# Patient Record
Sex: Female | Born: 1990 | Race: Black or African American | Hispanic: No | Marital: Single | Smoking: Current some day smoker
Health system: Southern US, Community
[De-identification: ages and names within clinical notes are randomized; demographics above are authoritative.]

## PROBLEM LIST (undated history)

## (undated) ENCOUNTER — Inpatient Hospital Stay: Payer: Self-pay

## (undated) DIAGNOSIS — I10 Essential (primary) hypertension: Secondary | ICD-10-CM

## (undated) DIAGNOSIS — D649 Anemia, unspecified: Secondary | ICD-10-CM

## (undated) HISTORY — PX: COLON SURGERY: SHX602

## (undated) HISTORY — PX: APPENDECTOMY: SHX54

## (undated) HISTORY — PX: SALPINGOSTOMY: SHX2372

---
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
## 2009-01-12 ENCOUNTER — Inpatient Hospital Stay: Payer: Self-pay | Admitting: Surgery

## 2009-01-21 ENCOUNTER — Emergency Department: Payer: Self-pay | Admitting: Emergency Medicine

## 2009-01-22 ENCOUNTER — Emergency Department: Payer: Self-pay | Admitting: Emergency Medicine

## 2009-02-18 ENCOUNTER — Ambulatory Visit: Payer: Self-pay | Admitting: Surgery

## 2009-10-21 ENCOUNTER — Ambulatory Visit: Payer: Self-pay | Admitting: Family Medicine

## 2009-10-27 ENCOUNTER — Observation Stay: Payer: Self-pay | Admitting: Obstetrics and Gynecology

## 2010-02-07 ENCOUNTER — Observation Stay: Payer: Self-pay | Admitting: Obstetrics and Gynecology

## 2010-03-17 ENCOUNTER — Inpatient Hospital Stay: Payer: Self-pay | Admitting: Obstetrics and Gynecology

## 2010-10-28 ENCOUNTER — Emergency Department: Payer: Self-pay | Admitting: Internal Medicine

## 2011-02-09 ENCOUNTER — Emergency Department: Payer: Self-pay | Admitting: Internal Medicine

## 2011-04-22 ENCOUNTER — Emergency Department: Payer: Self-pay | Admitting: Emergency Medicine

## 2011-08-22 ENCOUNTER — Emergency Department: Payer: Self-pay | Admitting: *Deleted

## 2011-08-22 ENCOUNTER — Emergency Department: Payer: Self-pay | Admitting: Emergency Medicine

## 2011-08-24 ENCOUNTER — Emergency Department: Payer: Self-pay | Admitting: *Deleted

## 2012-01-30 ENCOUNTER — Emergency Department: Payer: Self-pay | Admitting: Emergency Medicine

## 2012-04-15 ENCOUNTER — Emergency Department: Payer: Self-pay | Admitting: Emergency Medicine

## 2012-04-15 LAB — URINALYSIS, COMPLETE
Bilirubin,UR: NEGATIVE
Glucose,UR: NEGATIVE mg/dL (ref 0–75)
Ketone: NEGATIVE
Nitrite: NEGATIVE
Ph: 6 (ref 4.5–8.0)
Protein: NEGATIVE
RBC,UR: 7 /HPF (ref 0–5)
Squamous Epithelial: 4

## 2012-05-27 ENCOUNTER — Emergency Department: Payer: Self-pay | Admitting: *Deleted

## 2013-02-12 ENCOUNTER — Emergency Department: Payer: Self-pay | Admitting: Emergency Medicine

## 2013-04-04 ENCOUNTER — Emergency Department: Payer: Self-pay | Admitting: Emergency Medicine

## 2013-04-05 LAB — URINALYSIS, COMPLETE
Bacteria: NONE SEEN
Bilirubin,UR: NEGATIVE
Blood: NEGATIVE
Glucose,UR: NEGATIVE mg/dL (ref 0–75)
Nitrite: NEGATIVE
Protein: NEGATIVE
RBC,UR: 1 /HPF (ref 0–5)
Specific Gravity: 1.024 (ref 1.003–1.030)
Squamous Epithelial: 1

## 2013-04-05 LAB — WET PREP, GENITAL

## 2013-09-20 ENCOUNTER — Emergency Department: Payer: Self-pay | Admitting: Emergency Medicine

## 2014-04-09 ENCOUNTER — Emergency Department: Payer: Self-pay | Admitting: Emergency Medicine

## 2014-11-23 NOTE — L&D Delivery Note (Addendum)
Delivery Note At 10:39 AM a viable and healthy female was delivered via Vaginal, Spontaneous Delivery (Presentation: Left Occiput Anterior).  APGAR: 9, 9; weight 8 lb 15.9 oz (4080 g).   Placenta status: Intact, Spontaneous.  Cord: 3 vessels with the following complications: None.  Anesthesia: Epidural  Episiotomy: None Lacerations: 1st degree Suture Repair: 3.0 vicryl Est. Blood Loss (mL): 1200 - postpartum hemorrhage  Mom to postpartum.  Baby to Couplet care / Skin to Skin.  Pt admitted from the health dept for IOL for late term- pitocin started overnight. AROM for clear fluid in the morning. Pt entered the second stage and began to push will over an intact perineum. FOB at bedside. Baby delivered followed immediately by the anterior shoulder. Short cord clamped at the perineum. Vigorous infant placed on maternal abdomen. Brisk vaginal bleeding for total of 1200 ml. Postpartum pitocin bolused prior to completion of 3rd stage. Placenta delivered and was intact. Manual extraction of large clots x3 from lower uterine segment. cytotec placed in rectum to assist with atony. Small 1st degree perineal tear that extended to right labia repair with 3-0 vicryl. To vaginal or cervical lacerations. Firm bimanual massage to assist in atony. At conclusion, firm fundus with minimal bleeding.   Christeen Douglas 06/20/2015, 11:01 AM

## 2014-11-29 ENCOUNTER — Ambulatory Visit: Payer: Self-pay | Admitting: Physician Assistant

## 2015-01-30 ENCOUNTER — Ambulatory Visit: Payer: Self-pay | Admitting: Physician Assistant

## 2015-04-26 ENCOUNTER — Observation Stay
Admission: EM | Admit: 2015-04-26 | Discharge: 2015-04-26 | Disposition: A | Discharge status: Home / Self Care | Payer: Medicaid Other | Attending: Obstetrics and Gynecology | Admitting: Obstetrics and Gynecology

## 2015-04-26 ENCOUNTER — Encounter: Payer: Self-pay | Admitting: *Deleted

## 2015-04-26 DIAGNOSIS — Z3A33 33 weeks gestation of pregnancy: Secondary | ICD-10-CM | POA: Diagnosis not present

## 2015-04-26 DIAGNOSIS — O4703 False labor before 37 completed weeks of gestation, third trimester: Secondary | ICD-10-CM | POA: Diagnosis not present

## 2015-04-26 HISTORY — DX: Anemia, unspecified: D64.9

## 2015-04-26 LAB — URINALYSIS COMPLETE WITH MICROSCOPIC (ARMC ONLY)
Bacteria, UA: NONE SEEN
Bilirubin Urine: NEGATIVE
Glucose, UA: NEGATIVE mg/dL
Hgb urine dipstick: NEGATIVE
LEUKOCYTES UA: NEGATIVE
Nitrite: NEGATIVE
PROTEIN: 30 mg/dL — AB
SPECIFIC GRAVITY, URINE: 1.026 (ref 1.005–1.030)
pH: 6 (ref 5.0–8.0)

## 2015-04-26 LAB — CBC
HCT: 33.9 % — ABNORMAL LOW (ref 35.0–47.0)
Hemoglobin: 10.3 g/dL — ABNORMAL LOW (ref 12.0–16.0)
MCH: 21.5 pg — ABNORMAL LOW (ref 26.0–34.0)
MCHC: 30.3 g/dL — ABNORMAL LOW (ref 32.0–36.0)
MCV: 70.9 fL — ABNORMAL LOW (ref 80.0–100.0)
Platelets: 229 10*3/uL (ref 150–440)
RBC: 4.78 MIL/uL (ref 3.80–5.20)
RDW: 15.5 % — ABNORMAL HIGH (ref 11.5–14.5)
WBC: 12.7 10*3/uL — ABNORMAL HIGH (ref 3.6–11.0)

## 2015-04-26 LAB — CHLAMYDIA/NGC RT PCR (ARMC ONLY)
CHLAMYDIA TR: NOT DETECTED
N GONORRHOEAE: NOT DETECTED

## 2015-04-26 NOTE — OB Triage Provider Note (Addendum)
S: Resting comfortably. Reports CTX Q15-30 min starting this AM. She had sex ~ 24 hours ago which may be linked. Also limits ability to check FFN. No LOF or VB. Active fetal movement. She has been in the hospital for 2 hours and feels better compared to when she arrived.   O: BP 132/81 mmHg  Pulse 95  Temp(Src) 99.1 F (37.3 C) (Oral)  Resp 16  Ht 5\' 3"  (1.6 m)  Wt 107.956 kg (238 lb)  BMI 42.17 kg/m2 Gen: NAD, AAOx3      Abd: FNTTP      Ext: Non-tender, Nonedmeatous    FHT: 130s mod var + accelerations no decelerations TOCO: quiet SVE: FT/50/-3 stable x 2 exams two hours apart   A/P: 24 y.o. [id number] at 6275w1d   Labor: Not laboring CTX Q15--20 minutes, subtle on the toco. Strict precautions given. TVUS not available to check cervical length, FFN not possible due to recent intercourse.  No evidence of UTI. GBS collected in case she labors. No history of PTL.   FWB: Reassuring Cat 1 tracing.  D/c home stable, precautions reviewed, follow-up as scheduled.    Teena Iraniavid M. Derrill KayGoodman MD, MPH [facility] [phone]

## 2015-04-26 NOTE — OB Triage Note (Signed)
Recvd to OBS 2 complaining of contractions and intermittent pain in her thighs.  Changed to gown and to bed.  EFM applied.

## 2015-04-30 ENCOUNTER — Observation Stay
Admission: EM | Admit: 2015-04-30 | Discharge: 2015-04-30 | Disposition: A | Discharge status: Home / Self Care | Payer: Medicaid Other | Attending: Obstetrics and Gynecology | Admitting: Obstetrics and Gynecology

## 2015-04-30 ENCOUNTER — Encounter: Payer: Self-pay | Admitting: *Deleted

## 2015-04-30 DIAGNOSIS — O26893 Other specified pregnancy related conditions, third trimester: Secondary | ICD-10-CM | POA: Diagnosis present

## 2015-04-30 DIAGNOSIS — Z3A34 34 weeks gestation of pregnancy: Secondary | ICD-10-CM | POA: Diagnosis not present

## 2015-04-30 DIAGNOSIS — N898 Other specified noninflammatory disorders of vagina: Secondary | ICD-10-CM | POA: Diagnosis present

## 2015-04-30 DIAGNOSIS — Z3490 Encounter for supervision of normal pregnancy, unspecified, unspecified trimester: Secondary | ICD-10-CM

## 2015-04-30 LAB — CULTURE, BETA STREP (GROUP B ONLY)

## 2015-06-19 ENCOUNTER — Encounter: Payer: Self-pay | Admitting: *Deleted

## 2015-06-19 ENCOUNTER — Inpatient Hospital Stay
Admission: EM | Admit: 2015-06-19 | Discharge: 2015-06-21 | DRG: 774 | Disposition: A | Discharge status: Home / Self Care | Payer: Medicaid Other | Attending: Obstetrics and Gynecology | Admitting: Obstetrics and Gynecology

## 2015-06-19 DIAGNOSIS — Z87891 Personal history of nicotine dependence: Secondary | ICD-10-CM

## 2015-06-19 DIAGNOSIS — O9902 Anemia complicating childbirth: Secondary | ICD-10-CM | POA: Diagnosis present

## 2015-06-19 DIAGNOSIS — O480 Post-term pregnancy: Secondary | ICD-10-CM | POA: Diagnosis present

## 2015-06-19 DIAGNOSIS — Z3A41 41 weeks gestation of pregnancy: Secondary | ICD-10-CM | POA: Diagnosis present

## 2015-06-19 LAB — CBC
HCT: 33.3 % — ABNORMAL LOW (ref 35.0–47.0)
Hemoglobin: 10.3 g/dL — ABNORMAL LOW (ref 12.0–16.0)
MCH: 21.3 pg — ABNORMAL LOW (ref 26.0–34.0)
MCHC: 30.9 g/dL — AB (ref 32.0–36.0)
MCV: 69 fL — ABNORMAL LOW (ref 80.0–100.0)
Platelets: 205 10*3/uL (ref 150–440)
RBC: 4.83 MIL/uL (ref 3.80–5.20)
RDW: 17.6 % — AB (ref 11.5–14.5)
WBC: 10 10*3/uL (ref 3.6–11.0)

## 2015-06-19 LAB — TYPE AND SCREEN
ABO/RH(D): O POS
Antibody Screen: NEGATIVE

## 2015-06-19 MED ORDER — CITRIC ACID-SODIUM CITRATE 334-500 MG/5ML PO SOLN: 30.0000 mL | ORAL | Status: DC | PRN

## 2015-06-19 MED ORDER — LIDOCAINE HCL (PF) 1 % IJ SOLN
30.0000 mL | INTRAMUSCULAR | Status: DC | PRN
  Filled 2015-06-19: qty 30

## 2015-06-19 MED ORDER — BUTORPHANOL TARTRATE 1 MG/ML IJ SOLN
2.0000 mg | INTRAMUSCULAR | Status: DC | PRN
  Administered 2015-06-20: 2 mg via INTRAVENOUS
  Filled 2015-06-19: qty 2

## 2015-06-19 MED ORDER — TERBUTALINE SULFATE 1 MG/ML IJ SOLN
0.2500 mg | Freq: Once | INTRAMUSCULAR | Status: AC | PRN
Start: 2015-06-19 — End: 2015-06-19

## 2015-06-19 MED ORDER — LACTATED RINGERS IV SOLN
INTRAVENOUS | Status: DC
  Administered 2015-06-19 – 2015-06-20 (×2): via INTRAVENOUS

## 2015-06-19 MED ORDER — OXYTOCIN BOLUS FROM INFUSION
500.0000 mL | INTRAVENOUS | Status: DC
  Administered 2015-06-20: 500 mL via INTRAVENOUS

## 2015-06-19 MED ORDER — ACETAMINOPHEN 325 MG PO TABS: 650.0000 mg | ORAL_TABLET | ORAL | Status: DC | PRN

## 2015-06-19 MED ORDER — LACTATED RINGERS IV SOLN: 500.0000 mL | INTRAVENOUS | Status: DC | PRN

## 2015-06-19 MED ORDER — OXYTOCIN 40 UNITS IN LACTATED RINGERS INFUSION - SIMPLE MED: 62.5000 mL/h | INTRAVENOUS | Status: DC

## 2015-06-19 MED ORDER — ONDANSETRON HCL 4 MG/2ML IJ SOLN
4.0000 mg | Freq: Four times a day (QID) | INTRAMUSCULAR | Status: DC | PRN
  Administered 2015-06-20 (×2): 4 mg via INTRAVENOUS
  Filled 2015-06-19 (×2): qty 2

## 2015-06-19 MED ORDER — OXYTOCIN 40 UNITS IN LACTATED RINGERS INFUSION - SIMPLE MED
1.0000 m[IU]/min | INTRAVENOUS | Status: DC
  Administered 2015-06-19: 1 m[IU]/min via INTRAVENOUS
  Filled 2015-06-19: qty 1000

## 2015-06-19 NOTE — H&P (Signed)
CC: IOL for PD  EDD: 06/13/15 by 12 wk [place]  24yo [id number] @ 40+6wks here for PD IOL. Doing well. No complaints.   APC: Phineas Real  1. uncomplicated  OBHX: NSVD 2004 7#15, NSVD 2011 6#11 GYN: h/o L tubal cyst, s/p lapx excision;  PMH: none Psurghx: s/p Lapx appendectomy 2010, Lapx tubal cyst excision 2009 SH: former smoker Meds: iron, PNV ALL: NKDA  Labs: 12/19/14 - O+, ab neg, hct 34.5, pap NILM, RI, VZV I, RPR NR, GC/CT neg, hbsag neg, hiv neg glucola 103,  05/22/15 - GBS neg  Sono:  01/30/15 - normal anatomy sono (reviewed), ant placenta  O: BP 132/87 mmHg  Pulse 94  Temp(Src) 98.4 F (36.9 C) (Oral)  Resp 18  Ht  (1.6 m)  Wt 111.585 kg (246 lb)  BMI 43.59 kg/m2  NAD RRR CTAB Soft, NT wwp  Leopolds: 3600g, vtx SVE: by RN 4/50/-3  EFM: 140 mod var, +accels, no decels Toco: irritable  A/P: 24yo [id number] @ 40+6wks here for PD IOL.  1. Pitocin  2. Epidural when uncomfortable 3. AROM after epidural 4. Precautions reviewed with patient 5. Depo PP 6. Breast feeding 7. MC wants circ, but not now  Ala Dach, MD

## 2015-06-20 ENCOUNTER — Inpatient Hospital Stay: Payer: Medicaid Other | Admitting: Anesthesiology

## 2015-06-20 ENCOUNTER — Encounter: Payer: Self-pay | Admitting: Anesthesiology

## 2015-06-20 LAB — ABO/RH: ABO/RH(D): O POS

## 2015-06-20 MED ORDER — EPHEDRINE 5 MG/ML INJ
10.0000 mg | INTRAVENOUS | Status: DC | PRN
  Filled 2015-06-20: qty 2

## 2015-06-20 MED ORDER — LANOLIN HYDROUS EX OINT: TOPICAL_OINTMENT | CUTANEOUS | Status: DC | PRN

## 2015-06-20 MED ORDER — PRENATAL MULTIVITAMIN CH
1.0000 | ORAL_TABLET | Freq: Every day | ORAL | Status: DC
  Filled 2015-06-20: qty 1

## 2015-06-20 MED ORDER — IBUPROFEN 600 MG PO TABS
600.0000 mg | ORAL_TABLET | Freq: Four times a day (QID) | ORAL | Status: DC
  Administered 2015-06-20 – 2015-06-21 (×3): 600 mg via ORAL
  Filled 2015-06-20 (×3): qty 1

## 2015-06-20 MED ORDER — WITCH HAZEL-GLYCERIN EX PADS: 1.0000 "application " | MEDICATED_PAD | CUTANEOUS | Status: DC | PRN

## 2015-06-20 MED ORDER — FERROUS FUMARATE 325 (106 FE) MG PO TABS
1.0000 | ORAL_TABLET | Freq: Two times a day (BID) | ORAL | Status: DC
  Administered 2015-06-20 (×2): 106 mg via ORAL
  Filled 2015-06-20 (×5): qty 1

## 2015-06-20 MED ORDER — SENNOSIDES-DOCUSATE SODIUM 8.6-50 MG PO TABS
2.0000 | ORAL_TABLET | ORAL | Status: DC
  Administered 2015-06-21: 2 via ORAL
  Filled 2015-06-20: qty 2

## 2015-06-20 MED ORDER — FENTANYL 2.5 MCG/ML W/ROPIVACAINE 0.2% IN NS 100 ML EPIDURAL INFUSION (ARMC-ANES)
EPIDURAL | Status: AC
Start: 2015-06-20 — End: 2015-06-20
  Filled 2015-06-20: qty 100

## 2015-06-20 MED ORDER — SODIUM CHLORIDE 0.9 % IV SOLN: 250.0000 mL | INTRAVENOUS | Status: DC | PRN

## 2015-06-20 MED ORDER — OXYTOCIN 40 UNITS IN LACTATED RINGERS INFUSION - SIMPLE MED
62.5000 mL/h | INTRAVENOUS | Status: DC | PRN
  Administered 2015-06-20: 62.5 mL/h via INTRAVENOUS
  Filled 2015-06-20: qty 1000

## 2015-06-20 MED ORDER — ZOLPIDEM TARTRATE 5 MG PO TABS: 5.0000 mg | ORAL_TABLET | Freq: Every evening | ORAL | Status: DC | PRN

## 2015-06-20 MED ORDER — FLEET ENEMA 7-19 GM/118ML RE ENEM: 1.0000 | ENEMA | Freq: Every day | RECTAL | Status: DC | PRN

## 2015-06-20 MED ORDER — DIPHENHYDRAMINE HCL 25 MG PO CAPS: 25.0000 mg | ORAL_CAPSULE | Freq: Four times a day (QID) | ORAL | Status: DC | PRN

## 2015-06-20 MED ORDER — SODIUM CHLORIDE 0.9 % IJ SOLN: 3.0000 mL | Freq: Two times a day (BID) | INTRAMUSCULAR | Status: DC

## 2015-06-20 MED ORDER — IBUPROFEN 600 MG PO TABS
600.0000 mg | ORAL_TABLET | Freq: Four times a day (QID) | ORAL | Status: DC
  Administered 2015-06-20: 600 mg via ORAL
  Filled 2015-06-20: qty 1

## 2015-06-20 MED ORDER — LIDOCAINE-EPINEPHRINE (PF) 1.5 %-1:200000 IJ SOLN
INTRAMUSCULAR | Status: DC | PRN
  Administered 2015-06-20: 3 mL via PERINEURAL

## 2015-06-20 MED ORDER — DIBUCAINE 1 % RE OINT: 1.0000 "application " | TOPICAL_OINTMENT | RECTAL | Status: DC | PRN

## 2015-06-20 MED ORDER — PHENYLEPHRINE 40 MCG/ML (10ML) SYRINGE FOR IV PUSH (FOR BLOOD PRESSURE SUPPORT)
80.0000 ug | PREFILLED_SYRINGE | INTRAVENOUS | Status: DC | PRN
  Filled 2015-06-20: qty 2

## 2015-06-20 MED ORDER — OXYTOCIN 10 UNIT/ML IJ SOLN
INTRAMUSCULAR | Status: AC
  Filled 2015-06-20: qty 2

## 2015-06-20 MED ORDER — FENTANYL 2.5 MCG/ML W/ROPIVACAINE 0.2% IN NS 100 ML EPIDURAL INFUSION (ARMC-ANES)
10.0000 mL/h | EPIDURAL | Status: DC
  Administered 2015-06-20: 10 mL/h via EPIDURAL

## 2015-06-20 MED ORDER — BENZOCAINE-MENTHOL 20-0.5 % EX AERO
1.0000 "application " | INHALATION_SPRAY | CUTANEOUS | Status: DC | PRN
  Filled 2015-06-20: qty 56

## 2015-06-20 MED ORDER — ACETAMINOPHEN 325 MG PO TABS: 650.0000 mg | ORAL_TABLET | ORAL | Status: DC | PRN

## 2015-06-20 MED ORDER — DIPHENHYDRAMINE HCL 50 MG/ML IJ SOLN: 12.5000 mg | INTRAMUSCULAR | Status: DC | PRN

## 2015-06-20 MED ORDER — ONDANSETRON HCL 4 MG PO TABS: 4.0000 mg | ORAL_TABLET | ORAL | Status: DC | PRN

## 2015-06-20 MED ORDER — SIMETHICONE 80 MG PO CHEW: 80.0000 mg | CHEWABLE_TABLET | ORAL | Status: DC | PRN

## 2015-06-20 MED ORDER — OXYCODONE-ACETAMINOPHEN 5-325 MG PO TABS
1.0000 | ORAL_TABLET | ORAL | Status: DC | PRN
  Administered 2015-06-20: 1 via ORAL
  Filled 2015-06-20: qty 1

## 2015-06-20 MED ORDER — SODIUM CHLORIDE 0.9 % IJ SOLN: 3.0000 mL | INTRAMUSCULAR | Status: DC | PRN

## 2015-06-20 MED ORDER — TETANUS-DIPHTH-ACELL PERTUSSIS 5-2.5-18.5 LF-MCG/0.5 IM SUSP: 0.5000 mL | Freq: Once | INTRAMUSCULAR | Status: DC

## 2015-06-20 MED ORDER — MEDROXYPROGESTERONE ACETATE 150 MG/ML IM SUSP: 150.0000 mg | Freq: Once | INTRAMUSCULAR | Status: DC

## 2015-06-20 MED ORDER — LIDOCAINE-EPINEPHRINE (PF) 1.5 %-1:200000 IJ SOLN: INTRAMUSCULAR | Status: DC | PRN

## 2015-06-20 MED ORDER — PRENATAL MULTIVITAMIN CH
1.0000 | ORAL_TABLET | Freq: Every day | ORAL | Status: DC
  Administered 2015-06-20: 1 via ORAL

## 2015-06-20 MED ORDER — BISACODYL 10 MG RE SUPP
10.0000 mg | Freq: Every day | RECTAL | Status: DC | PRN
Start: 2015-06-20 — End: 2015-06-21

## 2015-06-20 MED ORDER — OXYCODONE-ACETAMINOPHEN 5-325 MG PO TABS
2.0000 | ORAL_TABLET | ORAL | Status: DC | PRN
Start: 2015-06-20 — End: 2015-06-21
  Administered 2015-06-21: 2 via ORAL
  Filled 2015-06-20: qty 2

## 2015-06-20 MED ORDER — AMMONIA AROMATIC IN INHA
RESPIRATORY_TRACT | Status: AC
  Filled 2015-06-20: qty 10

## 2015-06-20 MED ORDER — ONDANSETRON HCL 4 MG/2ML IJ SOLN: 4.0000 mg | INTRAMUSCULAR | Status: DC | PRN

## 2015-06-20 MED ORDER — FENTANYL 2.5 MCG/ML W/ROPIVACAINE 0.2% IN NS 100 ML EPIDURAL INFUSION (ARMC-ANES)
EPIDURAL | Status: AC
  Administered 2015-06-20: 10 mL/h via EPIDURAL
  Filled 2015-06-20: qty 100

## 2015-06-20 MED ORDER — BUPIVACAINE HCL (PF) 0.25 % IJ SOLN
INTRAMUSCULAR | Status: DC | PRN
  Administered 2015-06-20: 10 mL

## 2015-06-20 MED ORDER — MISOPROSTOL 200 MCG PO TABS
ORAL_TABLET | ORAL | Status: AC
  Administered 2015-06-20: 200 ug via RECTAL
  Filled 2015-06-20: qty 4

## 2015-06-20 MED ORDER — LIDOCAINE HCL (PF) 1 % IJ SOLN
INTRAMUSCULAR | Status: AC
  Filled 2015-06-20: qty 30

## 2015-06-20 MED ORDER — MEASLES, MUMPS & RUBELLA VAC ~~LOC~~ INJ: 0.5000 mL | INJECTION | Freq: Once | SUBCUTANEOUS | Status: DC

## 2015-06-20 NOTE — Progress Notes (Signed)
Patient ID: Laura Watkins, female   DOB: January 06, 1991, 24 y.o.   MRN: [id number]  24yo G3p2 @ 41wks here for PD IOL. Doing well. S/p epidural, on pitocin.   O: BP 111/62 mmHg  Pulse 85  Temp(Src) 98.1 F (36.7 C) (Oral)  Resp 18  Ht  (1.6 m)  Wt 111.585 kg (246 lb)  BMI 43.59 kg/m2  SpO2 100%  SVE: 5/70/-2, AROM'd clear fluid EFM: 120, mod var, +accels, no decels Toco: Q20min  A/P:  - continue IOL, transitioning into active labor - check in 2 hrs or pressure - cat 1 tracing  Ala Dach, MD

## 2015-06-20 NOTE — Progress Notes (Signed)
Laura Watkins is a 24 y.o. G3P2002 at [redacted]w[redacted]d admitted for induction for postdates  Subjective: Feeling strong urge to push; epidural working well  Objective: BP 108/52 mmHg  Pulse 109  Temp(Src) 98.7 F (37.1 C) (Oral)  Resp 20  Ht  (1.6 m)  Wt 111.585 kg (246 lb)  BMI 43.59 kg/m2  SpO2 100% I/O last 3 completed shifts: In: 1204.3 [I.V.:1204.3] Out: -  Total I/O In: 150.5 [I.V.:150.5] Out: 150 [Emesis/NG output:150]  FHT:  FHR: 135 bpm, variability: moderate,  accelerations:  Present,  decelerations:  Present variables UC:   regular, every 2 minutes SVE:   Dilation: 10 Effacement (%): 100 Station: -1 Exam by:: TFH   Presentation: LOT/LOA  Labs: Lab Results  Component Value Date   WBC 10.0 06/19/2015   HGB 10.3* 06/19/2015   HCT 33.3* 06/19/2015   MCV 69.0* 06/19/2015   PLT 205 06/19/2015    Assessment / Plan: Progressing through second stage; pushing effectively. Anticipate vaginal delivery. Stool at bedside for suprapubic if needed.    Christeen Douglas 06/20/2015, 10:15 AM

## 2015-06-20 NOTE — Anesthesia Preprocedure Evaluation (Signed)
Anesthesia Evaluation  Patient identified by MRN, date of birth, ID band Patient awake    Reviewed: Allergy & Precautions, NPO status , Patient's Chart, lab work & pertinent test results  Airway Mallampati: II  TM Distance: >3 FB     Dental no notable dental hx.    Pulmonary former smoker,  breath sounds clear to auscultation  Pulmonary exam normal       Cardiovascular negative cardio ROS Normal cardiovascular exam    Neuro/Psych negative neurological ROS     GI/Hepatic negative GI ROS, Neg liver ROS,   Endo/Other  negative endocrine ROS  Renal/GU negative Renal ROS  negative genitourinary   Musculoskeletal negative musculoskeletal ROS (+)   Abdominal Normal abdominal exam  (+)   Peds negative pediatric ROS (+)  Hematology  (+) anemia ,   Anesthesia Other Findings   Reproductive/Obstetrics (+) Pregnancy                             Anesthesia Physical Anesthesia Plan  ASA: II  Anesthesia Plan: Epidural   Post-op Pain Management:    Induction: Intravenous  Airway Management Planned:   Additional Equipment:   Intra-op Plan:   Post-operative Plan:   Informed Consent: I have reviewed the patients History and Physical, chart, labs and discussed the procedure including the risks, benefits and alternatives for the proposed anesthesia with the patient or authorized representative who has indicated his/her understanding and acceptance.   Dental advisory given  Plan Discussed with: CRNA and Surgeon  Anesthesia Plan Comments:         Anesthesia Quick Evaluation

## 2015-06-20 NOTE — Anesthesia Procedure Notes (Signed)
Epidural Patient location during procedure: OB Start time: 06/20/2015 2:26 AM End time: 06/20/2015 2:48 AM  Staffing Anesthesiologist: Yves Dill Performed by: anesthesiologist   Preanesthetic Checklist Completed: patient identified, site marked, surgical consent, pre-op evaluation, timeout performed, IV checked, risks and benefits discussed and monitors and equipment checked  Epidural Patient position: sitting Prep: Betadine and site prepped and draped Patient monitoring: heart rate, cardiac monitor, continuous pulse ox and blood pressure Approach: midline Location: L4-L5 Injection technique: LOR air  Needle:  Needle type: Tuohy  Needle gauge: 18 G Needle length: 9 cm Catheter type: closed end flexible Catheter size: 20 Guage Test dose: negative and 1.5% lidocaine with Epi 1:200 K  Assessment Sensory level: T8  Additional Notes Time out called.  Pt prepped and draped in sterile fashion.  A skin wheal was made at the L3-L4 interspace and epidurally space easily accessed.  Catheter threaded with return of blood.  Skin wheal made at L4-L5 interspace after appropriate local with 1% Lidocaine plain and catheter threaded easily approximately 3cm with no aspiration of blood and negative test dose and no paresthesias.  Patient was highly mobile at times and was cautioned.  Wide prep area covered down to sacral area. Reason for block:procedure for pain

## 2015-06-21 LAB — CBC
HEMATOCRIT: 29.9 % — AB (ref 35.0–47.0)
Hemoglobin: 9.3 g/dL — ABNORMAL LOW (ref 12.0–16.0)
MCH: 21.9 pg — ABNORMAL LOW (ref 26.0–34.0)
MCHC: 31.2 g/dL — AB (ref 32.0–36.0)
MCV: 70.2 fL — ABNORMAL LOW (ref 80.0–100.0)
PLATELETS: 170 10*3/uL (ref 150–440)
RBC: 4.25 MIL/uL (ref 3.80–5.20)
RDW: 18 % — ABNORMAL HIGH (ref 11.5–14.5)
WBC: 10.7 10*3/uL (ref 3.6–11.0)

## 2015-06-21 LAB — RPR: RPR Ser Ql: NONREACTIVE

## 2015-06-21 MED ORDER — IBUPROFEN 600 MG PO TABS: 600.0000 mg | ORAL_TABLET | Freq: Four times a day (QID) | ORAL | Status: DC

## 2015-06-21 MED ORDER — MEDROXYPROGESTERONE ACETATE 150 MG/ML IM SUSP
150.0000 mg | Freq: Once | INTRAMUSCULAR | Status: AC
  Administered 2015-06-21: 150 mg via INTRAMUSCULAR
  Filled 2015-06-21: qty 1

## 2015-06-21 MED ORDER — LANOLIN HYDROUS EX OINT: 1.0000 "application " | TOPICAL_OINTMENT | CUTANEOUS | Status: DC | PRN

## 2015-06-21 NOTE — Discharge Summary (Signed)
  Obstetric Discharge Summary   Patient ID: Laura Watkins MRN: [id number] DOB/AGE: 12-27-1990 24 y.o.   Date of Admission: 06/19/2015  Date of Discharge:   Admitting Diagnosis: Induction of labor at [redacted]w[redacted]d  Secondary Diagnosis: None  Mode of Delivery: normal spontaneous vaginal delivery     Discharge Diagnosis: Term vaginal delivery   Intrapartum Procedures: Atificial rupture of membranes, epidural and pitocin augmentation   Post partum procedures: Depo Provera  Complications: 1st degree perineal laceration   Brief Hospital Course  Laura Watkins is a [id number] who had a SVD on 06/20/15;  for further details of this delivery, please refer to the delivery note.  Patient had an uncomplicated postpartum course.  By time of discharge on PPD#1, her pain was controlled on oral pain medications; she had appropriate lochia and was ambulating, voiding without difficulty and tolerating regular diet.  She was deemed stable for discharge to home.  No further bleeding after delivery of placenta.     Labs: CBC Latest Ref Rng 06/21/2015 06/19/2015 04/26/2015  WBC 3.6 - 11.0 K/uL 10.7 10.0 12.7(H)  Hemoglobin 12.0 - 16.0 g/dL 1.4(N) 10.3(L) 10.3(L)  Hematocrit 35.0 - 47.0 % 29.9(L) 33.3(L) 33.9(L)  Platelets 150 - 440 K/uL 170 205 229   O POS  Physical exam:  Blood pressure 113/70, pulse 69, temperature 98.3 F (36.8 C), temperature source Oral, resp. rate 14, height  (1.6 m), weight 111.585 kg (246 lb), SpO2 100 %, unknown if currently breastfeeding. General: alert and no distress Lochia: appropriate Abdomen: soft, NT Uterine Fundus: firm Extremities: No evidence of DVT seen on physical exam. No lower extremity edema.  Discharge Instructions: Per After Visit Summary. Activity: Advance as tolerated. Pelvic rest for 6 weeks.  Also refer to After Visit Summary Diet: Regular Medications:   Medication List    TAKE these medications        ibuprofen 600 MG tablet  Commonly known  as:  ADVIL,MOTRIN  Take 1 tablet (600 mg total) by mouth every 6 (six) hours.     lanolin Oint  Apply 1 application topically as needed (for breast care).       Outpatient follow up:      Follow-up Information    Follow up with Christeen Douglas, MD In 6 weeks.   Specialty:  Obstetrics and Gynecology   Why:  For postpartum visit   Contact information:   [address] [place] [address] [phone]      Postpartum contraception: Depo-Provera  Discharged Condition: good  Discharged to: home   Newborn Data:  Baby boy  Disposition:home with mother  Apgars: APGAR (1 MIN): 9   APGAR (5 MINS): 9   APGAR (10 MINS):    Baby Feeding: Breast  Christeen Douglas, MD 06/21/2015

## 2015-06-21 NOTE — Discharge Instructions (Signed)
Care After Vaginal Delivery °Congratulations on your new baby!! ° °Refer to this sheet in the next few weeks. These discharge instructions provide you with information on caring for yourself after delivery. Your caregiver may also give you specific instructions. Your treatment has been planned according to the most current medical practices available, but problems sometimes occur. Call your caregiver if you have any problems or questions after you go home. ° °HOME CARE INSTRUCTIONS °· Take over-the-counter or prescription medicines only as directed by your caregiver or pharmacist. °· Do not drink alcohol, especially if you are breastfeeding or taking medicine to relieve pain. °· Do not chew or smoke tobacco. °· Do not use illegal drugs. °· Continue to use good perineal care. Good perineal care includes: °¨ Wiping your perineum from front to back. °¨ Keeping your perineum clean. °· Do not use tampons or douche until your caregiver says it is okay. °· Shower, wash your hair, and take tub baths as directed by your caregiver. °· Wear a well-fitting bra that provides breast support. °· Eat healthy foods. °· Drink enough fluids to keep your urine clear or pale yellow. °· Eat high-fiber foods such as whole grain cereals and breads, brown rice, beans, and fresh fruits and vegetables every day. These foods may help prevent or relieve constipation. °· Follow your caregiver's recommendations regarding resumption of activities such as climbing stairs, driving, lifting, exercising, or traveling. Specifically, no driving for two weeks, so that you are comfortable reacting quickly in an emergency. °· Talk to your caregiver about resuming sexual activities. Resumption of sexual activities is dependent upon your risk of infection, your rate of healing, and your comfort and desire to resume sexual activity. Usually we recommend waiting about six weeks, or until your bleeding stops and you are interested in sex. °· Try to have someone  help you with your household activities and your newborn for at least a few days after you leave the hospital. Even longer is better. °· Rest as much as possible. Try to rest or take a nap when your newborn is sleeping. Sleep deprivation can be very hard after delivery. °· Increase your activities gradually. °· Keep all of your scheduled postpartum appointments. It is very important to keep your scheduled follow-up appointments. At these appointments, your caregiver will be checking to make sure that you are healing physically and emotionally. ° °SEEK MEDICAL CARE IF:  °· You are passing large clots from your vagina.  °· You have a foul smelling discharge from your vagina. °· You have trouble urinating. °· You are urinating frequently. °· You have pain when you urinate. °· You have a change in your bowel movements. °· You have increasing redness, pain, or swelling near your vaginal incision (episiotomy) or vaginal tear. °· You have pus draining from your episiotomy or vaginal tear. °· Your episiotomy or vaginal tear is separating. °· You have painful, hard, or reddened breasts. °· You have a severe headache. °· You have blurred vision or see spots. °· You feel sad or depressed. °· You have thoughts of hurting yourself or your newborn. °· You have questions about your care, the care of your newborn, or medicines. °· You are dizzy or light-headed. °· You have a rash. °· You have nausea or vomiting. °· You were breastfeeding and have not had a menstrual period within 12 weeks after you stopped breastfeeding. °· You are not breastfeeding and have not had a menstrual period by the 12th week after delivery. °· You   have a fever. ° °SEEK IMMEDIATE MEDICAL CARE IF:  °· You have persistent pain. °· You have chest pain. °· You have shortness of breath. °· You faint. °· You have leg pain. °· You have stomach pain. °· Your vaginal bleeding saturates two or more sanitary pads in 1 hour. ° °MAKE SURE YOU:  °· Understand these  instructions. °· Will get help right away if you are not doing well or get worse. °·  °Document Released: 11/06/2000 Document Revised: 03/26/2014 Document Reviewed: 07/06/2012 ° °Exit[employer]® Patient Information ©2015 [employer]. This information is not intended to replace advice given to you by your health care provider. Make sure you discuss any questions you have with your health care provider. ° °

## 2015-06-21 NOTE — Anesthesia Postprocedure Evaluation (Signed)
  Anesthesia Post-op Note  Patient: Laura Watkins  Procedure(s) Performed: * No procedures listed *  Anesthesia type:Epidural  Patient [phone]  Post pain: Pain level controlled  Post assessment: Post-op Vital signs reviewed, Patient's Cardiovascular Status Stable, Respiratory Function Stable, Patent Airway and No signs of Nausea or vomiting  Post vital signs: Reviewed and stable  Last Vitals:  Filed Vitals:   06/21/15 0323  BP: 138/79  Pulse: 82  Temp: 36.9 C  Resp: 20    Level of consciousness: awake, alert  and patient cooperative  Complications: No apparent anesthesia complications

## 2015-06-21 NOTE — Progress Notes (Signed)
FOB was asleep in recliner with baby skin-to-skin and covered in blankets. Awakened from sleep and educated him and Mom on the importance of placing baby in bassinet on back while sleeping.

## 2015-06-21 NOTE — Progress Notes (Signed)
Discharge teaching completed for mother and baby. Pt verbalized understanding. Pt discharged home with infant.

## 2015-10-22 ENCOUNTER — Emergency Department
Admission: EM | Admit: 2015-10-22 | Discharge: 2015-10-22 | Disposition: A | Discharge status: Home / Self Care | Payer: Self-pay | Attending: Emergency Medicine | Admitting: Emergency Medicine

## 2015-10-22 ENCOUNTER — Encounter: Payer: Self-pay | Admitting: Medical Oncology

## 2015-10-22 DIAGNOSIS — M25562 Pain in left knee: Secondary | ICD-10-CM | POA: Insufficient documentation

## 2015-10-22 DIAGNOSIS — Z87891 Personal history of nicotine dependence: Secondary | ICD-10-CM | POA: Insufficient documentation

## 2015-10-22 DIAGNOSIS — Z791 Long term (current) use of non-steroidal anti-inflammatories (NSAID): Secondary | ICD-10-CM | POA: Insufficient documentation

## 2015-10-22 MED ORDER — MELOXICAM 15 MG PO TABS: 15.0000 mg | ORAL_TABLET | Freq: Every day | ORAL | Status: DC

## 2015-10-22 NOTE — Discharge Instructions (Signed)

## 2015-10-22 NOTE — ED Provider Notes (Signed)
[facility] Emergency Department Provider Note  ____________________________________________  Time seen: Approximately 7:22 AM  I have reviewed the triage vital signs and the nursing notes.   HISTORY  Chief Complaint Knee Pain    HPI Laura Watkins is a 24 y.o. female who presents to the emergency department complaining of left knee pain. Per the patient the pain has been present for 7 days. She denies injury leading up to pain. She states that symptoms began gradually and have increased over the intervening period. She denies any swelling. She tried over-the-counter medications with no relief. Patient denies any history of previous injury or surgery to knee. Patient reports the pain is sharp, on the lateral aspect of her knee. She denies any numbness or tingling distal.   Past Medical History  Diagnosis Date  . Anemia     Patient Active Problem List   Diagnosis Date Noted  . Normal spontaneous vaginal delivery 06/20/2015    Past Surgical History  Procedure Laterality Date  . Appendectomy    . Colon surgery      Current Outpatient Rx  Name  Route  Sig  Dispense  Refill  . ibuprofen (ADVIL,MOTRIN) 600 MG tablet   Oral   Take 1 tablet (600 mg total) by mouth every 6 (six) hours.   30 tablet   0   . lanolin OINT   Topical   Apply 1 application topically as needed (for breast care).   28 g   0   . meloxicam (MOBIC) 15 MG tablet   Oral   Take 1 tablet (15 mg total) by mouth daily.   30 tablet   0     Allergies Review of patient's allergies indicates no known allergies.  No family history on file.  Social History Social History  Substance Use Topics  . Smoking status: Former Games developermoker  . Smokeless tobacco: Never Used  . Alcohol Use: No    Review of Systems Constitutional: No fever/chills Eyes: No visual changes. ENT: No sore throat. Cardiovascular: Denies chest pain. Respiratory: Denies shortness of breath. Gastrointestinal:  No abdominal pain.  No nausea, no vomiting.  No diarrhea.  No constipation. Genitourinary: Negative for dysuria. Musculoskeletal: Negative for back pain. Endorses left knee pain. Skin: Negative for rash. Neurological: Negative for headaches, focal weakness or numbness.  10-point ROS otherwise negative.  ____________________________________________   PHYSICAL EXAM:  VITAL SIGNS: ED Triage Vitals  Enc Vitals Group     BP 10/22/15 0710 137/93 mmHg     Pulse Rate 10/22/15 0710 85     Resp 10/22/15 0710 18     Temp 10/22/15 0710 98.5 F (36.9 C)     Temp src --      SpO2 10/22/15 0710 97 %     Weight 10/22/15 0710 225 lb (102.059 kg)     Height 10/22/15 0710 5\' 3"  (1.6 m)     Head Cir --      Peak Flow --      Pain Score 10/22/15 0714 9     Pain Loc --      Pain Edu? --      Excl. in GC? --     Constitutional: Alert and oriented. Well appearing and in no acute distress. Eyes: Conjunctivae are normal. PERRL. EOMI. Head: Atraumatic. Nose: No congestion/rhinnorhea. Mouth/Throat: Mucous membranes are moist.  Oropharynx non-erythematous. Neck: No stridor.   Cardiovascular: Normal rate, regular rhythm. Grossly normal heart sounds.  Good peripheral circulation. Respiratory: Normal respiratory effort.  No retractions. Lungs CTAB. Gastrointestinal: Soft and nontender. No distention. No abdominal bruits. No CVA tenderness. Musculoskeletal: No lower extremity tenderness nor edema.  No joint effusions. No visible deformity to left knee when compared with the right. Tenderness to palpation over the lateral joint line. No palpable abnormality. Varus, valgus, Lachman's are negative. Patient does have a positive McMurray test for left lateral knee. Neurologic:  Normal speech and language. No gross focal neurologic deficits are appreciated. No gait instability. Skin:  Skin is warm, dry and intact. No rash noted. Psychiatric: Mood and affect are normal. Speech and behavior are  normal.  ____________________________________________   LABS (all labs ordered are listed, but only abnormal results are displayed)  Labs Reviewed - No data to display ____________________________________________  EKG   ____________________________________________  RADIOLOGY   ____________________________________________   PROCEDURES  Procedure(s) performed: None  Critical Care performed: No  ____________________________________________   INITIAL IMPRESSION / ASSESSMENT AND PLAN / ED COURSE  Pertinent labs & imaging results that were available during my care of the patient were reviewed by me and considered in my medical decision making (see chart for details).  Patient's history, symptoms, physical exam take into consideration her diagnosis. Patient has no definitive injury prior to symptoms. Patient does have a positive McMurray test and I advised patient that symptoms are likely due to lateral meniscus pathology. Advised patient to take prescribed anti-inflammatories for 2-3 weeks and evaluate for symptomatic relief. If patient is still experiencing symptoms, the patient will follow-up with orthopedics for further evaluation and testing. The patient verbalizes understanding of diagnosis and treatment plan and verbalizes compliance with same. ____________________________________________   FINAL CLINICAL IMPRESSION(S) / ED DIAGNOSES  Final diagnoses:  Lateral knee pain, left      Racheal Patches, PA-C 10/22/15 0737  Myrna Blazer, MD 10/22/15 [phone]

## 2015-10-22 NOTE — ED Notes (Signed)
Pt reports that she has been having left knee pain x 1 week without injury.

## 2015-10-22 NOTE — ED Notes (Signed)
C/o left knee pain.  

## 2016-04-22 ENCOUNTER — Emergency Department: Payer: Self-pay

## 2016-04-22 ENCOUNTER — Encounter: Payer: Self-pay | Admitting: Emergency Medicine

## 2016-04-22 ENCOUNTER — Emergency Department
Admission: EM | Admit: 2016-04-22 | Discharge: 2016-04-22 | Disposition: A | Discharge status: Home / Self Care | Payer: Self-pay | Attending: Emergency Medicine | Admitting: Emergency Medicine

## 2016-04-22 DIAGNOSIS — Z87891 Personal history of nicotine dependence: Secondary | ICD-10-CM | POA: Insufficient documentation

## 2016-04-22 DIAGNOSIS — W19XXXA Unspecified fall, initial encounter: Secondary | ICD-10-CM

## 2016-04-22 DIAGNOSIS — M25561 Pain in right knee: Secondary | ICD-10-CM

## 2016-04-22 DIAGNOSIS — Z79899 Other long term (current) drug therapy: Secondary | ICD-10-CM | POA: Insufficient documentation

## 2016-04-22 DIAGNOSIS — Y990 Civilian activity done for income or pay: Secondary | ICD-10-CM | POA: Insufficient documentation

## 2016-04-22 DIAGNOSIS — Y9389 Activity, other specified: Secondary | ICD-10-CM | POA: Insufficient documentation

## 2016-04-22 DIAGNOSIS — Y929 Unspecified place or not applicable: Secondary | ICD-10-CM | POA: Insufficient documentation

## 2016-04-22 DIAGNOSIS — S93401A Sprain of unspecified ligament of right ankle, initial encounter: Secondary | ICD-10-CM | POA: Insufficient documentation

## 2016-04-22 DIAGNOSIS — W010XXA Fall on same level from slipping, tripping and stumbling without subsequent striking against object, initial encounter: Secondary | ICD-10-CM | POA: Insufficient documentation

## 2016-04-22 MED ORDER — HYDROCODONE-ACETAMINOPHEN 5-325 MG PO TABS
1.0000 | ORAL_TABLET | Freq: Once | ORAL | Status: AC
  Administered 2016-04-22: 1 via ORAL

## 2016-04-22 MED ORDER — HYDROCODONE-ACETAMINOPHEN 5-325 MG PO TABS: 1.0000 | ORAL_TABLET | Freq: Four times a day (QID) | ORAL | Status: DC | PRN

## 2016-04-22 MED ORDER — IBUPROFEN 800 MG PO TABS
800.0000 mg | ORAL_TABLET | Freq: Once | ORAL | Status: AC
  Administered 2016-04-22: 800 mg via ORAL

## 2016-04-22 MED ORDER — IBUPROFEN 800 MG PO TABS: 800.0000 mg | ORAL_TABLET | Freq: Three times a day (TID) | ORAL | Status: DC | PRN

## 2016-04-22 NOTE — ED Notes (Signed)
Pt reports being "half asleep" putting her pants when she tripped and fell to her knees. Pt denies any dizziness, weakness, or lightheadedness prior to. Pt reports RIGHT knee and RIGHT ankle pain.

## 2016-04-22 NOTE — ED Notes (Signed)
ACE wrap applied, crutches given to pt

## 2016-04-22 NOTE — ED Notes (Signed)
Pt present to triage in wheelchair, reports fell this morning while getting ready for work. States was "putting on my pants and the next thing I know I was on my knees." Pt is now c/o lower back pain, RIGHT knee pain and RIGHT ankle pain. (+) movement, sensation and pulse on right foot, reports able to bear weight to right leg but pain increases. Pt denies hitting head or LOC. Pt alert and oriented x 4, respirations even and unlabored.

## 2016-04-22 NOTE — Discharge Instructions (Signed)
1. You may take pain medicines as needed (Motrin/Norco #15). 2. Elevate affected area and apply ice several times daily. 3. You may remove elastic bandage as needed. 4. Return to the ER for worsening symptoms, persistent vomiting, difficulty breathing or other concerns.  Ankle Sprain An ankle sprain is an injury to the strong, fibrous tissues (ligaments) that hold your ankle bones together.  HOME CARE   Put ice on your ankle for 1-2 days or as told by your doctor.  Put ice in a plastic bag.  Place a towel between your skin and the bag.  Leave the ice on for 15-20 minutes at a time, every 2 hours while you are awake.  Only take medicine as told by your doctor.  Raise (elevate) your injured ankle above the level of your heart as much as possible for 2-3 days.  Use crutches if your doctor tells you to. Slowly put your own weight on the affected ankle. Use the crutches until you can walk without pain.  If you have a plaster splint:  Do not rest it on anything harder than a pillow for 24 hours.  Do not put weight on it.  Do not get it wet.  Take it off to shower or bathe.  If given, use an elastic wrap or support stocking for support. Take the wrap off if your toes lose feeling (numb), tingle, or turn cold or blue.  If you have an air splint:  Add or let out air to make it comfortable.  Take it off at night and to shower and bathe.  Wiggle your toes and move your ankle up and down often while you are wearing it. GET HELP IF:  You have rapidly increasing bruising or puffiness (swelling).  Your toes feel very cold.  You lose feeling in your foot.  Your medicine does not help your pain. GET HELP RIGHT AWAY IF:   Your toes lose feeling (numb) or turn blue.  You have severe pain that is increasing. MAKE SURE YOU:   Understand these instructions.  Will watch your condition.  Will get help right away if you are not doing well or get worse.   This information is not  intended to replace advice given to you by your health care provider. Make sure you discuss any questions you have with your health care provider.   Document Released: 04/27/2008 Document Revised: 11/30/2014 Document Reviewed: 05/23/2012 Elsevier Interactive Patient Education 2016 Elsevier Inc.  Cryotherapy Cryotherapy is when you put ice on your injury. Ice helps lessen pain and puffiness (swelling) after an injury. Ice works the best when you start using it in the first 24 to 48 hours after an injury. HOME CARE  Put a dry or damp towel between the ice pack and your skin.  You may press gently on the ice pack.  Leave the ice on for no more than 10 to 20 minutes at a time.  Check your skin after 5 minutes to make sure your skin is okay.  Rest at least 20 minutes between ice pack uses.  Stop using ice when your skin loses feeling (numbness).  Do not use ice on someone who cannot tell you when it hurts. This includes small children and people with memory problems (dementia). GET HELP RIGHT AWAY IF:  You have white spots on your skin.  Your skin turns blue or pale.  Your skin feels waxy or hard.  Your puffiness gets worse. MAKE SURE YOU:   Understand these  instructions.  Will watch your condition.  Will get help right away if you are not doing well or get worse.   This information is not intended to replace advice given to you by your health care provider. Make sure you discuss any questions you have with your health care provider.   Document Released: 04/27/2008 Document Revised: 02/01/2012 Document Reviewed: 07/02/2011 Elsevier Interactive Patient Education 2016 Elsevier Inc.  Adult nurse and RICE WHAT DOES AN ELASTIC BANDAGE DO? Elastic bandages come in different shapes and sizes. They generally provide support to your injury and reduce swelling while you are healing, but they can perform different functions. Your health care provider will help you to decide what  is best for your protection, recovery, or rehabilitation following an injury. WHAT ARE SOME GENERAL TIPS FOR USING AN ELASTIC BANDAGE?  Use the bandage as directed by the maker of the bandage that you are using.  Do not wrap the bandage too tightly. This may cut off the circulation in the arm or leg in the area below the bandage.  If part of your body beyond the bandage becomes blue, numb, cold, swollen, or is more painful, your bandage is most likely too tight. If this occurs, remove your bandage and reapply it more loosely.  See your health care provider if the bandage seems to be making your problems worse rather than better.  An elastic bandage should be removed and reapplied every 3-4 hours or as directed by your health care provider. WHAT IS RICE? The routine care of many injuries includes rest, ice, compression, and elevation (RICE therapy).  Rest Rest is required to allow your body to heal. Generally, you can resume your routine activities when you are comfortable and have been given permission by your health care provider. Ice Icing your injury helps to keep the swelling down and it reduces pain. Do not apply ice directly to your skin.  Put ice in a plastic bag.  Place a towel between your skin and the bag.  Leave the ice on for 20 minutes, 2-3 times per day. Do this for as long as you are directed by your health care provider. Compression Compression helps to keep swelling down, gives support, and helps with discomfort. Compression may be done with an elastic bandage. Elevation Elevation helps to reduce swelling and it decreases pain. If possible, your injured area should be placed at or above the level of your heart or the center of your chest. WHEN SHOULD I SEEK MEDICAL CARE? You should seek medical care if:  You have persistent pain and swelling.  Your symptoms are getting worse rather than improving. These symptoms may indicate that further evaluation or further  X-rays are needed. Sometimes, X-rays may not show a small broken bone (fracture) until a number of days later. Make a follow-up appointment with your health care provider. Ask when your X-ray results will be ready. Make sure that you get your X-ray results. WHEN SHOULD I SEEK IMMEDIATE MEDICAL CARE? You should seek immediate medical care if:  You have a sudden onset of severe pain at or below the area of your injury.  You develop redness or increased swelling around your injury.  You have tingling or numbness at or below the area of your injury that does not improve after you remove the elastic bandage.   This information is not intended to replace advice given to you by your health care provider. Make sure you discuss any questions you have with your health  care provider.   Document Released: 05/01/2002 Document Revised: 07/31/2015 Document Reviewed: 06/25/2014 Elsevier Interactive Patient Education 2016 Elsevier Inc.  Knee Pain Knee pain is a very common symptom and can have many causes. Knee pain often goes away when you follow your health care provider's instructions for relieving pain and discomfort at home. However, knee pain can develop into a condition that needs treatment. Some conditions may include:  Arthritis caused by wear and tear (osteoarthritis).  Arthritis caused by swelling and irritation (rheumatoid arthritis or gout).  A cyst or growth in your knee.  An infection in your knee joint.  An injury that will not heal.  Damage, swelling, or irritation of the tissues that support your knee (torn ligaments or tendinitis). If your knee pain continues, additional tests may be ordered to diagnose your condition. Tests may include X-rays or other imaging studies of your knee. You may also need to have fluid removed from your knee. Treatment for ongoing knee pain depends on the cause, but treatment may include:  Medicines to relieve pain or swelling.  Steroid injections in  your knee.  Physical therapy.  Surgery. HOME CARE INSTRUCTIONS  Take medicines only as directed by your health care provider.  Rest your knee and keep it raised (elevated) while you are resting.  Do not do things that cause or worsen pain.  Avoid high-impact activities or exercises, such as running, jumping rope, or doing jumping jacks.  Apply ice to the knee area:  Put ice in a plastic bag.  Place a towel between your skin and the bag.  Leave the ice on for 20 minutes, 2-3 times a day.  Ask your health care provider if you should wear an elastic knee support.  Keep a pillow under your knee when you sleep.  Lose weight if you are overweight. Extra weight can put pressure on your knee.  Do not use any tobacco products, including cigarettes, chewing tobacco, or electronic cigarettes. If you need help quitting, ask your health care provider. Smoking may slow the healing of any bone and joint problems that you may have. SEEK MEDICAL CARE IF:  Your knee pain continues, changes, or gets worse.  You have a fever along with knee pain.  Your knee buckles or locks up.  Your knee becomes more swollen. SEEK IMMEDIATE MEDICAL CARE IF:   Your knee joint feels hot to the touch.  You have chest pain or trouble breathing.   This information is not intended to replace advice given to you by your health care provider. Make sure you discuss any questions you have with your health care provider.   Document Released: 09/06/2007 Document Revised: 11/30/2014 Document Reviewed: 06/25/2014 Elsevier Interactive Patient Education [employer]

## 2016-04-22 NOTE — ED Provider Notes (Signed)
[facility] Emergency Department Provider Note   ____________________________________________  Time seen: Approximately 6:06 AM  I have reviewed the triage vital signs and the nursing notes.   HISTORY  Chief Complaint Fall    HPI Laura Watkins is a 25 y.o. female who presents to the ED from home with a chief complaint of fall with right knee and ankle pain. Patient reports she fell this morning approximately 3 AM while getting ready for work. She was putting on her pants, lost her balance and fell onto her knees. Did not strike head or suffer LOC. Complains of right knee and ankle pain. Denies lower back pain as reported per triage nurse. Denies associated neck pain, chest pain, shortness of breath, abdominal pain, nausea, vomiting, diarrhea. She was able to bear weight but this is experiencing more pain since the fall. Do not take any pain reliever prior to arrival. Movement makes her pain worse.   Past Medical History  Diagnosis Date  . Anemia     Patient Active Problem List   Diagnosis Date Noted  . Normal spontaneous vaginal delivery 06/20/2015    Past Surgical History  Procedure Laterality Date  . Appendectomy    . Colon surgery      Current Outpatient Rx  Name  Route  Sig  Dispense  Refill  . HYDROcodone-acetaminophen (NORCO) 5-325 MG tablet   Oral   Take 1 tablet by mouth every 6 (six) hours as needed for moderate pain.   15 tablet   0   . ibuprofen (ADVIL,MOTRIN) 800 MG tablet   Oral   Take 1 tablet (800 mg total) by mouth every 8 (eight) hours as needed for moderate pain.   15 tablet   0   . lanolin OINT   Topical   Apply 1 application topically as needed (for breast care).   28 g   0   . meloxicam (MOBIC) 15 MG tablet   Oral   Take 1 tablet (15 mg total) by mouth daily.   30 tablet   0     Allergies Review of patient's allergies indicates no known allergies.  No family history on file.  Social History Social  History  Substance Use Topics  . Smoking status: Former Games developer  . Smokeless tobacco: Never Used  . Alcohol Use: Yes     Comment: occasional    Review of Systems  Constitutional: No fever/chills. Eyes: No visual changes. ENT: No sore throat. Cardiovascular: Denies chest pain. Respiratory: Denies shortness of breath. Gastrointestinal: No abdominal pain.  No nausea, no vomiting.  No diarrhea.  No constipation. Genitourinary: Negative for dysuria. Musculoskeletal: Positive for right knee and ankle pain. Negative for back pain. Skin: Negative for rash. Neurological: Negative for headaches, focal weakness or numbness.  10-point ROS otherwise negative.  ____________________________________________   PHYSICAL EXAM:  VITAL SIGNS: ED Triage Vitals  Enc Vitals Group     BP 04/22/16 0429 141/99 mmHg     Pulse Rate 04/22/16 0429 90     Resp 04/22/16 0429 16     Temp 04/22/16 0429 98.3 F (36.8 C)     Temp Source 04/22/16 0429 Oral     SpO2 04/22/16 0429 100 %     Weight 04/22/16 0429 235 lb (106.595 kg)     Height 04/22/16 0429  (1.6 m)     Head Cir --      Peak Flow --      Pain Score 04/22/16 0429 8  Pain Loc --      Pain Edu? --      Excl. in GC? --     Constitutional: Alert and oriented. Well appearing and in no acute distress. Eyes: Conjunctivae are normal. PERRL. EOMI. Head: Atraumatic. Nose: No congestion/rhinnorhea. Mouth/Throat: Mucous membranes are moist.  Oropharynx non-erythematous. Neck: No stridor.  No cervical spine tenderness to palpation. Cardiovascular: Normal rate, regular rhythm. Grossly normal heart sounds.  Good peripheral circulation. Respiratory: Normal respiratory effort.  No retractions. Lungs CTAB. Gastrointestinal: Soft and nontender. No distention. No abdominal bruits. No CVA tenderness. Musculoskeletal: Right anterior knee tender to palpation. No swelling noted. Full range of motion with some pain. Right lateral ankle without swelling;  mild tenderness to palpation. Full range of motion with some pain.  No joint effusions. Neurologic:  Normal speech and language. No gross focal neurologic deficits are appreciated.  Skin:  Skin is warm, dry and intact. No rash noted. Psychiatric: Mood and affect are normal. Speech and behavior are normal.  ____________________________________________   LABS (all labs ordered are listed, but only abnormal results are displayed)  Labs Reviewed - No data to display ____________________________________________  EKG  None ____________________________________________  RADIOLOGY  Right ankle xrays (viewed by me, interpreted per Dr. Register): Diffuse soft tissue swelling. No acute bony abnormality.  Right knee xrays (viewed by me, interpreted per Dr. Register): Negative. ____________________________________________   PROCEDURES  Procedure(s) performed: None  Critical Care performed: No  ____________________________________________   INITIAL IMPRESSION / ASSESSMENT AND PLAN / ED COURSE  Pertinent labs & imaging results that were available during my care of the patient were reviewed by me and considered in my medical decision making (see chart for details).  25 year old female who presents s/p mechanical fall with right knee and ankle pain. Will obtain imaging studies, administer analgesia and reassess.  ----------------------------------------- 7:37 AM on 04/22/2016 -----------------------------------------  Updated patient of imaging results. Will Ace wrap ankle, provide crutches, analgesia and orthopedics follow-up as needed. Strict return precautions given. Patient verbalizes understanding and agrees with plan of care. ____________________________________________   FINAL CLINICAL IMPRESSION(S) / ED DIAGNOSES  Final diagnoses:  Fall, initial encounter  Ankle sprain, right, initial encounter  Right knee pain      NEW MEDICATIONS STARTED DURING THIS  VISIT:  New Prescriptions   HYDROCODONE-ACETAMINOPHEN (NORCO) 5-325 MG TABLET    Take 1 tablet by mouth every 6 (six) hours as needed for moderate pain.   IBUPROFEN (ADVIL,MOTRIN) 800 MG TABLET    Take 1 tablet (800 mg total) by mouth every 8 (eight) hours as needed for moderate pain.     Note:  This document was prepared using Dragon voice recognition software and may include unintentional dictation errors.    Irean HongJade J Sung, MD 04/22/16 [phone]

## 2016-05-04 ENCOUNTER — Encounter: Payer: Self-pay | Admitting: Emergency Medicine

## 2016-05-04 ENCOUNTER — Emergency Department
Admission: EM | Admit: 2016-05-04 | Discharge: 2016-05-04 | Disposition: A | Discharge status: Home / Self Care | Payer: Self-pay | Attending: Emergency Medicine | Admitting: Emergency Medicine

## 2016-05-04 DIAGNOSIS — H109 Unspecified conjunctivitis: Secondary | ICD-10-CM | POA: Insufficient documentation

## 2016-05-04 DIAGNOSIS — Z791 Long term (current) use of non-steroidal anti-inflammatories (NSAID): Secondary | ICD-10-CM | POA: Insufficient documentation

## 2016-05-04 DIAGNOSIS — Z87891 Personal history of nicotine dependence: Secondary | ICD-10-CM | POA: Insufficient documentation

## 2016-05-04 DIAGNOSIS — Z79899 Other long term (current) drug therapy: Secondary | ICD-10-CM | POA: Insufficient documentation

## 2016-05-04 MED ORDER — TOBRAMYCIN 0.3 % OP SOLN: 1.0000 [drp] | OPHTHALMIC | Status: AC

## 2016-05-04 NOTE — ED Notes (Addendum)
Patient presents to the ED with redness to left eye.  Patient reports her boss at work noticed it and thought it might be pink eye.  Patient is in no distress at this time.  Patient denies blurred vision to eye.  Patient states pain to left eye began yesterday.

## 2016-05-04 NOTE — ED Notes (Signed)
Developed some redness with min swelling to left eye 2 days ago  also noticed some drainage to same eye

## 2016-05-04 NOTE — ED Provider Notes (Signed)
[facility] Emergency Department Provider Note  ____________________________________________  Time seen: Approximately 10:00 AM  I have reviewed the triage vital signs and the nursing notes.   HISTORY  Chief Complaint Eye Color Change    HPI Laura Watkins is a 25 y.o. female who presents for evaluation of redness and minimal swelling to left eye 2 days. Patient also reports having drainage this morning out of the left eye with stuck together upon wakening   Past Medical History  Diagnosis Date  . Anemia     Patient Active Problem List   Diagnosis Date Noted  . Normal spontaneous vaginal delivery 06/20/2015    Past Surgical History  Procedure Laterality Date  . Appendectomy    . Colon surgery      Current Outpatient Rx  Name  Route  Sig  Dispense  Refill  . HYDROcodone-acetaminophen (NORCO) 5-325 MG tablet   Oral   Take 1 tablet by mouth every 6 (six) hours as needed for moderate pain.   15 tablet   0   . ibuprofen (ADVIL,MOTRIN) 800 MG tablet   Oral   Take 1 tablet (800 mg total) by mouth every 8 (eight) hours as needed for moderate pain.   15 tablet   0   . lanolin OINT   Topical   Apply 1 application topically as needed (for breast care).   28 g   0   . meloxicam (MOBIC) 15 MG tablet   Oral   Take 1 tablet (15 mg total) by mouth daily.   30 tablet   0   . tobramycin (TOBREX) 0.3 % ophthalmic solution   Left Eye   Place 1 drop into the left eye every 4 (four) hours.   5 mL   0     Allergies Review of patient's allergies indicates no known allergies.  No family history on file.  Social History Social History  Substance Use Topics  . Smoking status: Former Games developer  . Smokeless tobacco: Never Used  . Alcohol Use: Yes     Comment: occasional    Review of Systems Constitutional: No fever/chills Eyes: No visual changes.Positive for red eye with drainage ENT: No sore throat. Skin: Negative for  rash. Neurological: Negative for headaches, focal weakness or numbness.  10-point ROS otherwise negative.  ____________________________________________   PHYSICAL EXAM:  VITAL SIGNS: ED Triage Vitals  Enc Vitals Group     BP 05/04/16 0857 142/106 mmHg     Pulse Rate 05/04/16 0857 74     Resp 05/04/16 0857 16     Temp 05/04/16 0857 98.5 F (36.9 C)     Temp Source 05/04/16 0857 Oral     SpO2 05/04/16 0857 98 %     Weight 05/04/16 0857 237 lb 12.8 oz (107.865 kg)     Height 05/04/16 0857  (1.6 m)     Head Cir --      Peak Flow --      Pain Score 05/04/16 0858 5     Pain Loc --      Pain Edu? --      Excl. in GC? --     Constitutional: Alert and oriented. Well appearing and in no acute distress. Eyes: Left eye with conjunctival erythema and drainage noted. Head: Atraumatic. Nose: No congestion/rhinnorhea. Mouth/Throat: Mucous membranes are moist.  Oropharynx non-erythematous. Neck: No stridor.  Full range of motion nontender Skin:  Skin is warm, dry and intact. No rash noted. Psychiatric: Mood and  affect are normal. Speech and behavior are normal.  ____________________________________________   LABS (all labs ordered are listed, but only abnormal results are displayed)  Labs Reviewed - No data to display ____________________________________________  EKG   ____________________________________________  RADIOLOGY   ____________________________________________   PROCEDURES  Procedure(s) performed: None  Critical Care performed: No  ____________________________________________   INITIAL IMPRESSION / ASSESSMENT AND PLAN / ED COURSE  Pertinent labs & imaging results that were available during my care of the patient were reviewed by me and considered in my medical decision making (see chart for details).  Acute conjunctivitis left eye. Rx given for Tobrex eyedrops. Work excuse 24 hours given patient follow-up with PCP or return ED with worsening  symptomology. ____________________________________________   FINAL CLINICAL IMPRESSION(S) / ED DIAGNOSES  Final diagnoses:  Conjunctivitis of left eye     This chart was dictated using voice recognition software/Dragon. Despite best efforts to proofread, errors can occur which can change the meaning. Any change was purely unintentional.   Evangeline Dakinharles M Ziona Wickens, PA-C 05/04/16 1051  Jennye MoccasinBrian S Quigley, MD 05/04/16 [phone]

## 2016-05-04 NOTE — Discharge Instructions (Signed)
Bacterial Conjunctivitis °Bacterial conjunctivitis, commonly called pink eye, is an inflammation of the clear membrane that covers the white part of the eye (conjunctiva). The inflammation can also happen on the underside of the eyelids. The blood vessels in the conjunctiva become inflamed, causing the eye to become red or pink. Bacterial conjunctivitis may spread easily from one eye to another and from person to person (contagious).  °CAUSES  °Bacterial conjunctivitis is caused by bacteria. The bacteria may come from your own skin, your upper respiratory tract, or from someone else with bacterial conjunctivitis. °SYMPTOMS  °The normally white color of the eye or the underside of the eyelid is usually pink or red. The pink eye is usually associated with irritation, tearing, and some sensitivity to light. Bacterial conjunctivitis is often associated with a thick, yellowish discharge from the eye. The discharge may turn into a crust on the eyelids overnight, which causes your eyelids to stick together. If a discharge is present, there may also be some blurred vision in the affected eye. °DIAGNOSIS  °Bacterial conjunctivitis is diagnosed by your caregiver through an eye exam and the symptoms that you report. Your caregiver looks for changes in the surface tissues of your eyes, which may point to the specific type of conjunctivitis. A sample of any discharge may be collected on a cotton-tip swab if you have a severe case of conjunctivitis, if your cornea is affected, or if you keep getting repeat infections that do not respond to treatment. The sample will be sent to a lab to see if the inflammation is caused by a bacterial infection and to see if the infection will respond to antibiotic medicines. °TREATMENT  °1. Bacterial conjunctivitis is treated with antibiotics. Antibiotic eyedrops are most often used. However, antibiotic ointments are also available. Antibiotics pills are sometimes used. Artificial tears or eye  washes may ease discomfort. °HOME CARE INSTRUCTIONS  °1. To ease discomfort, apply a cool, clean washcloth to your eye for 10-20 minutes, 3-4 times a day. °2. Gently wipe away any drainage from your eye with a warm, wet washcloth or a cotton ball. °3. Wash your hands often with soap and water. Use paper towels to dry your hands. °4. Do not share towels or washcloths. This may spread the infection. °5. Change or wash your pillowcase every day. °6. You should not use eye makeup until the infection is gone. °7. Do not operate machinery or drive if your vision is blurred. °8. Stop using contact lenses. Ask your caregiver how to sterilize or replace your contacts before using them again. This depends on the type of contact lenses that you use. °9. When applying medicine to the infected eye, do not touch the edge of your eyelid with the eyedrop bottle or ointment tube. °SEEK IMMEDIATE MEDICAL CARE IF:  °· Your infection has not improved within 3 days after beginning treatment. °· You had yellow discharge from your eye and it returns. °· You have increased eye pain. °· Your eye redness is spreading. °· Your vision becomes blurred. °· You have a fever or persistent symptoms for more than 2-3 days. °· You have a fever and your symptoms suddenly get worse. °· You have facial pain, redness, or swelling. °MAKE SURE YOU:  °· Understand these instructions. °· Will watch your condition. °· Will get help right away if you are not doing well or get worse. °  °This information is not intended to replace advice given to you by your health care provider. Make sure you   discuss any questions you have with your health care provider. °  °Document Released: 11/09/2005 Document Revised: 11/30/2014 Document Reviewed: 04/11/2012 °Elsevier Interactive Patient Education ©2016 [employer] ° °How to Use Eye Drops and Eye Ointments °HOW TO APPLY EYE DROPS °Follow these steps when applying eye drops: °2. Wash your hands. °3. Tilt your head  back. °4. Put a finger under your eye and use it to gently pull your lower lid downward. Keep that finger in place. °5. Using your other hand, hold the dropper between your thumb and index finger. °6. Position the dropper just over the edge of the lower lid. Hold it as close to your eye as you can without touching the dropper to your eye. °7. Steady your hand. One way to do this is to lean your index finger against your brow. °8. Look up. °9. Slowly and gently squeeze one drop of medicine into your eye. °10. Close your eye. °11. Place a finger between your lower eyelid and your nose. Press gently for 2 minutes. This increases the amount of time that the medicine is exposed to the eye. It also reduces side effects that can develop if the drop gets into the bloodstream through the nose. °HOW TO APPLY EYE OINTMENTS °Follow these steps when applying eye ointments: °10. Wash your hands. °11. Put a finger under your eye and use it to gently pull your lower lid downward. Keep that finger in place. °12. Using your other hand, place the tip of the tube between your thumb and index finger with the remaining fingers braced against your cheek or nose. °13. Hold the tube just over the edge of your lower lid without touching the tube to your lid or eyeball. °14. Look up. °15. Line the inner part of your lower lid with ointment. °16. Gently pull up on your upper lid and look down. This will force the ointment to spread over the surface of the eye. °17. Release the upper lid. °18. If you can, close your eyes for 1-2 minutes. °Do not rub your eyes. If you applied the ointment correctly, your vision will be blurry for a few minutes. This is normal. °ADDITIONAL INFORMATION °· Make sure to use the eye drops or ointment as told by your health care provider. °· If you have been told to use both eye drops and an eye ointment, apply the eye drops first, then wait 3-4 minutes before you apply the ointment. °· Try not to touch the tip of the  dropper or tube to your eye. A dropper or tube that has touched the eye can become contaminated. °  °This information is not intended to replace advice given to you by your health care provider. Make sure you discuss any questions you have with your health care provider. °  °Document Released: 02/15/2001 Document Revised: 03/26/2015 Document Reviewed: 11/05/2014 °Elsevier Interactive Patient Education ©2016 [employer] ° °

## 2016-11-23 NOTE — L&D Delivery Note (Signed)
Delivery Note At 8:35 PM a viable and healthy female "Laura Watkins" was delivered via Vaginal, Spontaneous Delivery (Presentation:ROA ;  ).  APGAR:8 ,9 ; weight  pending Placenta status: intact, spontaneous. Velamentous cord insertion. 3VC, nuchal x1  Anesthesia:  epidural Episiotomy:  none Lacerations: tiny bilateral labial, hemostatic Suture Repair: n/a Est. Blood Loss (mL): 200ml   Mom to postpartum.  Baby to Couplet care / Skin to Skin.  26yo [id number] at 40+3wks admitted in active labor. AROM for clear fluid. Pitocin of 3 from 6 to 10 cm, and baby delivered with several pushes in ROA position with nuchal cord that was delivered through. FOB cut cord on maternal abdomen after 60sec delayed cord clamping. Placenta delivered spontaneously and was intact. Minimal bleeding. Cord was inserted at the very margin, with membranes surrounding. No tears in cord or membranes.  I also delivered their last baby boy 2 yrs ago "Laura Watkins"   Bottle feeding, depo shot, with planned vasectomy.  Christeen DouglasBethany Brooksie Ellwanger 07/31/2017, 8:51 PM

## 2016-12-02 ENCOUNTER — Emergency Department: Payer: Self-pay

## 2016-12-02 ENCOUNTER — Emergency Department
Admission: EM | Admit: 2016-12-02 | Discharge: 2016-12-02 | Disposition: A | Discharge status: Home / Self Care | Payer: Self-pay | Attending: Emergency Medicine | Admitting: Emergency Medicine

## 2016-12-02 DIAGNOSIS — Z791 Long term (current) use of non-steroidal anti-inflammatories (NSAID): Secondary | ICD-10-CM | POA: Insufficient documentation

## 2016-12-02 DIAGNOSIS — O9A219 Injury, poisoning and certain other consequences of external causes complicating pregnancy, unspecified trimester: Secondary | ICD-10-CM | POA: Insufficient documentation

## 2016-12-02 DIAGNOSIS — Y929 Unspecified place or not applicable: Secondary | ICD-10-CM | POA: Insufficient documentation

## 2016-12-02 DIAGNOSIS — Z87891 Personal history of nicotine dependence: Secondary | ICD-10-CM | POA: Insufficient documentation

## 2016-12-02 DIAGNOSIS — W458XXA Other foreign body or object entering through skin, initial encounter: Secondary | ICD-10-CM | POA: Insufficient documentation

## 2016-12-02 DIAGNOSIS — Y9389 Activity, other specified: Secondary | ICD-10-CM | POA: Insufficient documentation

## 2016-12-02 DIAGNOSIS — M79672 Pain in left foot: Secondary | ICD-10-CM

## 2016-12-02 DIAGNOSIS — S90852A Superficial foreign body, left foot, initial encounter: Secondary | ICD-10-CM | POA: Insufficient documentation

## 2016-12-02 DIAGNOSIS — Y999 Unspecified external cause status: Secondary | ICD-10-CM | POA: Insufficient documentation

## 2016-12-02 DIAGNOSIS — Z3A00 Weeks of gestation of pregnancy not specified: Secondary | ICD-10-CM | POA: Insufficient documentation

## 2016-12-02 LAB — POCT PREGNANCY, URINE: Preg Test, Ur: POSITIVE — AB

## 2016-12-02 MED ORDER — LIDOCAINE HCL (PF) 1 % IJ SOLN
5.0000 mL | Freq: Once | INTRAMUSCULAR | Status: AC
  Administered 2016-12-02: 5 mL
  Filled 2016-12-02: qty 5

## 2016-12-02 MED ORDER — CEPHALEXIN 500 MG PO CAPS: 500.0000 mg | ORAL_CAPSULE | Freq: Two times a day (BID) | ORAL | 0 refills | Status: AC

## 2016-12-02 NOTE — ED Triage Notes (Signed)
Pt presents to ED for possible piece of ornament x 1.5 weeks. Small puncture to base of big toe on ball of L foot. States "I been digging at it."

## 2016-12-02 NOTE — Discharge Instructions (Signed)
Keep wound clean and dry.   No foreign body seen on xray nor on incisional investigation.   Take antibiotics as prescribed.   Follow up with your PCP for wound recheck as needed.

## 2016-12-02 NOTE — ED Provider Notes (Signed)
[facility] Emergency Department Provider Note  ____________________________________________  Time seen: Approximately 12:41 PM  I have reviewed the triage vital signs and the nursing notes.   HISTORY  Chief Complaint Foot Pain    HPI Laura Watkins is a 26 y.o. female, NAD, presents to the emergency department for evaluation of pain about the sole of her foot. Patient states that approximately 1.5 weeks ago, several of her Christmas ornaments broke and she may have stepped on a piece. Patient states there was bleeding and pain to the area below the left great toe at that time. Patient has been "digging at it" with a needle to remove the object, but only a small amount of pus has been visualized. Patient states she is able to walk, but it causes pain. Patient rates pain as 7 out of 10. Patient denies any numbness, tingling, or weakness of the left foot. Has not noted any redness, swelling or abnormal warmth. Is not diabetic. Has had no fevers, chills or body aches. Denies abdominal pain, nausea or vomiting.   Past Medical History:  Diagnosis Date  . Anemia     Patient Active Problem List   Diagnosis Date Noted  . Normal spontaneous vaginal delivery 06/20/2015    Past Surgical History:  Procedure Laterality Date  . APPENDECTOMY    . COLON SURGERY      Prior to Admission medications   Medication Sig Start Date End Date Taking? Authorizing Provider  cephALEXin (KEFLEX) 500 MG capsule Take 1 capsule (500 mg total) by mouth 2 (two) times daily. 12/02/16 12/09/16  Jami L Hagler, PA-C  HYDROcodone-acetaminophen (NORCO) 5-325 MG tablet Take 1 tablet by mouth every 6 (six) hours as needed for moderate pain. 04/22/16   Irean Hong, MD  ibuprofen (ADVIL,MOTRIN) 800 MG tablet Take 1 tablet (800 mg total) by mouth every 8 (eight) hours as needed for moderate pain. 04/22/16   Irean Hong, MD  lanolin OINT Apply 1 application topically as needed (for breast care). 06/21/15    Christeen Douglas, MD  meloxicam (MOBIC) 15 MG tablet Take 1 tablet (15 mg total) by mouth daily. 10/22/15   Delorise Royals Cuthriell, PA-C    Allergies Patient has no known allergies.  History reviewed. No pertinent family history.  Social History Social History  Substance Use Topics  . Smoking status: Former Games developer  . Smokeless tobacco: Never Used  . Alcohol use Yes     Comment: occasional     Review of Systems  Constitutional: No fever/chills, Fatigue Cardiovascular: No chest pain. Respiratory: No shortness of breath.  Gastrointestinal: No abdominal pain.  No nausea, vomiting.   Musculoskeletal: Positive left foot pain.  Skin: Positive for former body sensation left foot. Negative for rash, redness, bleeding, oozing, weeping. Neurological: Negative for numbness, weakness, tingling.  ____________________________________________   PHYSICAL EXAM:  VITAL SIGNS: ED Triage Vitals  Enc Vitals Group     BP 12/02/16 1148 132/69     Pulse Rate 12/02/16 1148 78     Resp 12/02/16 1148 18     Temp 12/02/16 1148 98.1 F (36.7 C)     Temp Source 12/02/16 1148 Oral     SpO2 12/02/16 1148 100 %     Weight 12/02/16 1149 230 lb (104.3 kg)     Height 12/02/16 1149 5\' 3"  (1.6 m)     Head Circumference --      Peak Flow --      Pain Score 12/02/16 1206 6  Pain Loc --      Pain Edu? --      Excl. in GC? --      Constitutional: Alert and oriented. Well appearing and in no acute distress. Eyes: Conjunctivae are normal.  Head: Atraumatic. Cardiovascular: Good peripheral circulation with 2+ pulses noted in the left lower extremity. Capillary refill is brisk in all digits of the left foot. Respiratory: Normal respiratory effort without tachypnea or retractions.  Musculoskeletal: Mild tenderness to palpation about the sole of the left foot about the base of the left great toe but no crepitus or bony abnormalities noted. Full range of motion of toes on left foot with 5/5 strength  against resistance.  Neurologic:  Normal speech and language. No gross focal neurologic deficits are appreciated. Sensation to light touch first intact. Left lower extremity. Skin:  Skin is warm, dry and intact. No rash noted. Approximately 2 mm hyperpigmented area noted to MTP area of right great toe. No foreign bodies visualized or palpated about the sole of the left foot. Psychiatric: Mood and affect are normal. Speech and behavior are normal. Patient exhibits appropriate insight and judgement.   ____________________________________________   LABS (all labs ordered are listed, but only abnormal results are displayed)  Labs Reviewed  POCT PREGNANCY, URINE - Abnormal; Notable for the following:       Result Value   Preg Test, Ur POSITIVE (*)    All other components within normal limits  POC URINE PREG, ED   ____________________________________________  EKG  None ____________________________________________  RADIOLOGY I, Ernestene Kiel Hagler, personally viewed and evaluated these images (plain radiographs) as part of my medical decision making, as well as reviewing the written report by the radiologist.  Dg Foot Complete Left  Result Date: 12/02/2016 CLINICAL DATA:  Pt has a piece of and ornament in the left foot at the base of her big toe. She has been trying to get it out for a week and a half. No previous injury. EXAM: LEFT FOOT - COMPLETE 3+ VIEW COMPARISON:  None. FINDINGS: There is no evidence of fracture or dislocation. There is no evidence of arthropathy or other focal bone abnormality. No radiodense foreign body or subcutaneous gas. Soft tissues are unremarkable. IMPRESSION: Negative. Electronically Signed   By: Corlis Leak M.D.   On: 12/02/2016 14:09    ____________________________________________    PROCEDURES  Procedure(s) performed: None   .Foreign Body Removal Date/Time: 12/02/2016 3:03 PM Performed by: Tye Savoy L Authorized by: Hope Pigeon  Consent: Verbal  consent obtained. Risks and benefits: risks, benefits and alternatives were discussed Consent given by: patient Patient understanding: patient states understanding of the procedure being performed Imaging studies: imaging studies available Patient identity confirmed: verbally with patient Body area: skin General location: lower extremity Location details: left foot Anesthesia: local infiltration  Anesthesia: Local Anesthetic: lidocaine 1% without epinephrine Anesthetic total: 2 mL  Sedation: Patient sedated: no Patient restrained: no Patient cooperative: yes Complexity: simple 0 objects recovered. Objects recovered: None Post-procedure assessment: No foreign bodies palpated or visualized. Patient tolerance: Patient tolerated the procedure well with no immediate complications     Medications  lidocaine (PF) (XYLOCAINE) 1 % injection 5 mL (5 mLs Infiltration Given 12/02/16 1450)     ____________________________________________   INITIAL IMPRESSION / ASSESSMENT AND PLAN / ED COURSE  Pertinent labs & imaging results that were available during my care of the patient were reviewed by me and considered in my medical decision making (see chart for details).  Clinical  Course as of Dec 02 1553  Wed Dec 02, 2016  1337 Patient notified of positive urine pregnancy test. Patient states that she has taken other urine pregnancy tests at home and a positive. States she would like to continue on with left foot x-ray and will sign pregnancy radiation waiver. Radiology notified.  [JH]    Clinical Course User Index [JH] Jami L Hagler, PA-C    Patient's diagnosis is consistent with Left foot pain. No foreign bodies were seen on x-ray nor palpated or visualized during incisional exploration. Patient was notified of positive pregnancy test as well and states that she has taken 2 pregnancy tests at home and both were positive. Patient will be discharged home with prescriptions for Keflex to  cover for potential infection about the foot. Patient is to follow up with her primary care provider in 48 hours for wound recheck if symptoms persist past this treatment course. Patient is given ED precautions to return to the ED for any worsening or new symptoms.    ____________________________________________  FINAL CLINICAL IMPRESSION(S) / ED DIAGNOSES  Final diagnoses:  Foot pain, left      NEW MEDICATIONS STARTED DURING THIS VISIT:  Discharge Medication List as of 12/02/2016  3:09 PM    START taking these medications   Details  cephALEXin (KEFLEX) 500 MG capsule Take 1 capsule (500 mg total) by mouth 2 (two) times daily., Starting Wed 12/02/2016, Until Wed 12/09/2016, Print             Hope PigeonJami L Hagler, PA-C 12/02/16 1556    Charlynne Panderavid Hsienta Yao, MD 12/04/16 [phone]

## 2016-12-11 ENCOUNTER — Other Ambulatory Visit: Payer: Self-pay | Admitting: Primary Care

## 2016-12-11 DIAGNOSIS — Z3201 Encounter for pregnancy test, result positive: Secondary | ICD-10-CM

## 2016-12-30 ENCOUNTER — Ambulatory Visit: Payer: Self-pay

## 2017-01-06 ENCOUNTER — Emergency Department
Admission: EM | Admit: 2017-01-06 | Discharge: 2017-01-06 | Disposition: A | Discharge status: Home / Self Care | Payer: Self-pay | Attending: Emergency Medicine | Admitting: Emergency Medicine

## 2017-01-06 DIAGNOSIS — Z3A11 11 weeks gestation of pregnancy: Secondary | ICD-10-CM | POA: Insufficient documentation

## 2017-01-06 DIAGNOSIS — O99611 Diseases of the digestive system complicating pregnancy, first trimester: Secondary | ICD-10-CM | POA: Insufficient documentation

## 2017-01-06 DIAGNOSIS — K529 Noninfective gastroenteritis and colitis, unspecified: Secondary | ICD-10-CM | POA: Insufficient documentation

## 2017-01-06 DIAGNOSIS — Z791 Long term (current) use of non-steroidal anti-inflammatories (NSAID): Secondary | ICD-10-CM | POA: Insufficient documentation

## 2017-01-06 DIAGNOSIS — Z87891 Personal history of nicotine dependence: Secondary | ICD-10-CM | POA: Insufficient documentation

## 2017-01-06 LAB — CBC WITH DIFFERENTIAL/PLATELET
Basophils Absolute: 0 10*3/uL (ref 0–0.1)
Basophils Relative: 1 %
EOS ABS: 0 10*3/uL (ref 0–0.7)
Eosinophils Relative: 0 %
HCT: 34.3 % — ABNORMAL LOW (ref 35.0–47.0)
HEMOGLOBIN: 11.2 g/dL — AB (ref 12.0–16.0)
Lymphocytes Relative: 26 %
Lymphs Abs: 2.1 10*3/uL (ref 1.0–3.6)
MCH: 22.4 pg — AB (ref 26.0–34.0)
MCHC: 32.6 g/dL (ref 32.0–36.0)
MCV: 68.7 fL — ABNORMAL LOW (ref 80.0–100.0)
MONO ABS: 0.5 10*3/uL (ref 0.2–0.9)
MONOS PCT: 6 %
NEUTROS PCT: 67 %
Neutro Abs: 5.5 10*3/uL (ref 1.4–6.5)
Platelets: 194 10*3/uL (ref 150–440)
RBC: 4.99 MIL/uL (ref 3.80–5.20)
RDW: 16 % — AB (ref 11.5–14.5)
WBC: 8.2 10*3/uL (ref 3.6–11.0)

## 2017-01-06 LAB — LIPASE, BLOOD: Lipase: 19 U/L (ref 11–51)

## 2017-01-06 LAB — COMPREHENSIVE METABOLIC PANEL
ALBUMIN: 3.6 g/dL (ref 3.5–5.0)
ALK PHOS: 43 U/L (ref 38–126)
ALT: 14 U/L (ref 14–54)
AST: 26 U/L (ref 15–41)
Anion gap: 5 (ref 5–15)
CALCIUM: 9.1 mg/dL (ref 8.9–10.3)
CHLORIDE: 105 mmol/L (ref 101–111)
CO2: 24 mmol/L (ref 22–32)
CREATININE: 0.37 mg/dL — AB (ref 0.44–1.00)
GFR calc Af Amer: >60 mL/min (ref >60)
GFR calc non Af Amer: >60 mL/min (ref >60)
GLUCOSE: 87 mg/dL (ref 65–99)
Potassium: 3.4 mmol/L — ABNORMAL LOW (ref 3.5–5.1)
SODIUM: 134 mmol/L — AB (ref 135–145)
Total Bilirubin: 0.7 mg/dL (ref 0.3–1.2)
Total Protein: 7.1 g/dL (ref 6.5–8.1)

## 2017-01-06 LAB — URINALYSIS, COMPLETE (UACMP) WITH MICROSCOPIC
BACTERIA UA: NONE SEEN
BILIRUBIN URINE: NEGATIVE
Glucose, UA: NEGATIVE mg/dL
Hgb urine dipstick: NEGATIVE
KETONES UR: 20 mg/dL — AB
LEUKOCYTES UA: NEGATIVE
Nitrite: NEGATIVE
Protein, ur: 30 mg/dL — AB
SPECIFIC GRAVITY, URINE: 1.024 (ref 1.005–1.030)
pH: 6 (ref 5.0–8.0)

## 2017-01-06 LAB — HCG, QUANTITATIVE, PREGNANCY: HCG, BETA CHAIN, QUANT, S: 68961 m[IU]/mL — AB (ref <5)

## 2017-01-06 MED ORDER — ONDANSETRON HCL 4 MG/2ML IJ SOLN
4.0000 mg | Freq: Once | INTRAMUSCULAR | Status: AC
  Administered 2017-01-06: 4 mg via INTRAVENOUS
  Filled 2017-01-06: qty 2

## 2017-01-06 MED ORDER — SODIUM CHLORIDE 0.9 % IV BOLUS (SEPSIS)
1000.0000 mL | Freq: Once | INTRAVENOUS | Status: AC
  Administered 2017-01-06: 1000 mL via INTRAVENOUS

## 2017-01-06 MED ORDER — ONDANSETRON HCL 4 MG PO TABS: 4.0000 mg | ORAL_TABLET | Freq: Three times a day (TID) | ORAL | 0 refills | Status: DC | PRN

## 2017-01-06 NOTE — ED Provider Notes (Addendum)
[facility] Emergency Department Provider Note  ____________________________________________   I have reviewed the triage vital signs and the nursing notes.   HISTORY  Chief Complaint Emesis During Pregnancy    HPI Laura Watkins is a 26 y.o. female who is G4P3, who is pregnant. Does get prenatal care. Last menstrual period wasNovember 29 making her about [redacted] weeks pregnant. Patient denies any vaginal complaints or spotting. She has had nausea vomiting and loose stools for 3 days. Positive sick contacts with the same. She states she had some epigastric pain while she was vomiting that is now gone. She denies any lower abdominal discomfort. Denies dysuria or urinary frequency.       Past Medical History:  Diagnosis Date  . Anemia     Patient Active Problem List   Diagnosis Date Noted  . Normal spontaneous vaginal delivery 06/20/2015    Past Surgical History:  Procedure Laterality Date  . APPENDECTOMY    . COLON SURGERY      Prior to Admission medications   Medication Sig Start Date End Date Taking? Authorizing Provider  HYDROcodone-acetaminophen (NORCO) 5-325 MG tablet Take 1 tablet by mouth every 6 (six) hours as needed for moderate pain. 04/22/16   Irean HongJade J Sung, MD  ibuprofen (ADVIL,MOTRIN) 800 MG tablet Take 1 tablet (800 mg total) by mouth every 8 (eight) hours as needed for moderate pain. 04/22/16   Irean HongJade J Sung, MD  lanolin OINT Apply 1 application topically as needed (for breast care). 06/21/15   Christeen DouglasBethany Beasley, MD  meloxicam (MOBIC) 15 MG tablet Take 1 tablet (15 mg total) by mouth daily. 10/22/15   Delorise RoyalsJonathan D Cuthriell, PA-C    Allergies Patient has no known allergies.  No family history on file.  Social History Social History  Substance Use Topics  . Smoking status: Former Games developermoker  . Smokeless tobacco: Never Used  . Alcohol use No     Comment: occasional    Review of Systems Constitutional: No fever/chills Eyes: No visual  changes. ENT: No sore throat. No stiff neck no neck pain Cardiovascular: Denies chest pain. Respiratory: Denies shortness of breath. Gastrointestinal:   See history of present illness Genitourinary: Negative for dysuria. Musculoskeletal: Negative lower extremity swelling Skin: Negative for rash. Neurological: Negative for severe headaches, focal weakness or numbness. 10-point ROS otherwise negative.  ____________________________________________   PHYSICAL EXAM:  VITAL SIGNS: ED Triage Vitals  Enc Vitals Group     BP 01/06/17 0827 (!) 137/91     Pulse Rate 01/06/17 0826 85     Resp 01/06/17 0826 17     Temp 01/06/17 0826 98.8 F (37.1 C)     Temp Source 01/06/17 0826 Oral     SpO2 01/06/17 0826 100 %     Weight 01/06/17 0826 220 lb (99.8 kg)     Height 01/06/17 0826 5\' 3"  (1.6 m)     Head Circumference --      Peak Flow --      Pain Score 01/06/17 0826 8     Pain Loc --      Pain Edu? --      Excl. in GC? --     Constitutional: Alert and oriented. Well appearing and in no acute distress. Eyes: Conjunctivae are normal. PERRL. EOMI. Head: Atraumatic. Nose: No congestion/rhinnorhea. Mouth/Throat: Mucous membranes are moist.  Oropharynx non-erythematous. Neck: No stridor.   Nontender with no meningismus Cardiovascular: Normal rate, regular rhythm. Grossly normal heart sounds.  Good peripheral circulation. Respiratory: Normal respiratory  effort.  No retractions. Lungs CTAB. Abdominal: Soft and Slight epigastric discomfort no right upper quadrant tenderness no lower abdominal discomfort, morbid obesity noted, uterus palpated in the pelvis. No distention. No guarding no rebound Back:  There is no focal tenderness or step off.  there is no midline tenderness there are no lesions noted. there is no CVA tenderness Musculoskeletal: No lower extremity tenderness, no upper extremity tenderness. No joint effusions, no DVT signs strong distal pulses no edema Neurologic:  Normal speech  and language. No gross focal neurologic deficits are appreciated.  Skin:  Skin is warm, dry and intact. No rash noted. Psychiatric: Mood and affect are normal. Speech and behavior are normal.  ____________________________________________   LABS (all labs ordered are listed, but only abnormal results are displayed)  Labs Reviewed  COMPREHENSIVE METABOLIC PANEL  CBC WITH DIFFERENTIAL/PLATELET  HCG, QUANTITATIVE, PREGNANCY  LIPASE, BLOOD  URINALYSIS, COMPLETE (UACMP) WITH MICROSCOPIC   ____________________________________________  EKG  I personally interpreted any EKGs ordered by me or triage  ____________________________________________  RADIOLOGY  I reviewed any imaging ordered by me or triage that were performed during my shift and, if possible, patient and/or family made aware of any abnormal findings. ____________________________________________   PROCEDURES  Procedure(s) performed: None  Procedures  Critical Care performed: None  ____________________________________________   INITIAL IMPRESSION / ASSESSMENT AND PLAN / ED COURSE  Pertinent labs & imaging results that were available during my care of the patient were reviewed by me and considered in my medical decision making (see chart for details).  Patient with a very benign abdomen with no vaginal bleeding or other complaints related to the pregnancy presents today complaining with nausea vomiting and diarrhea. Very large community burden of the same with multiple patients currently present in the emergency room with same complaints. Nonsurgical abdomen. Do not think this likely represents therefore ectopic pregnancy etc. We will give her IV fluid, we will give her Zofran after discussing the risks benefits and alternatives to Zofran, which she agrees with. Patient has had no bleeding emesis, she has no evidence of appendicitis or gallbladder disorder. In fact, patient has had appendicitis in the past. It is my hope  that we can get her feeling better. We will defer ultrasound at this time given that the does not appear to be any pregnancy- related complaints. We will however use bedside ultrasound to get fetal heart tones.  ----------------------------------------- 10:30 AM on 01/06/2017 -----------------------------------------  Patient feeling much better no longer nauseated abdomen is completely benign to palpation, no evidence of tenderness. Pain is gone. Also, bedside ultrasound shows what appears to be an IUP with a fetal heart tones of 133. Obviously this is a limited bedside ultrasound to confirm IUP only.   ----------------------------------------- 12:37 PM on 01/06/2017 -----------------------------------------  She remains in no acute distress, vital signs are reassuring blood work is reassuring abdominal exam is completely benign we will discharge her with close up vision follow-up. A sugar Splint. She is asking for a work note.   ____________________________________________   FINAL CLINICAL IMPRESSION(S) / ED DIAGNOSES  Final diagnoses:  None      This chart was dictated using voice recognition software.  Despite best efforts to proofread,  errors can occur which can change meaning.      Jeanmarie Plant, MD 01/06/17 1610    Jeanmarie Plant, MD 01/06/17 1032    Jeanmarie Plant, MD 01/06/17 [phone]

## 2017-01-06 NOTE — ED Notes (Signed)
Pt verbalized understanding of discharge instructions. NAD at this time. 

## 2017-01-06 NOTE — ED Notes (Signed)
Pt given water by ED Medic for PO challenge

## 2017-01-06 NOTE — Discharge Instructions (Signed)
If you have increased abdominal pain, any lower abdominal pain, high fever, weakness, vomiting blood, bloody diarrhea, vaginal bleeding, severe cramping or any other new or worrisome symptoms return to the emergency department.

## 2017-01-06 NOTE — ED Triage Notes (Signed)
Pt states she is 3 months pregnant and has had generalized abd pain with N/V for the past 3 days.. Denies diarrhea or bleeding.

## 2017-02-18 LAB — OB RESULTS CONSOLE GC/CHLAMYDIA
Chlamydia: NEGATIVE
Gonorrhea: NEGATIVE

## 2017-02-18 LAB — OB RESULTS CONSOLE HEPATITIS B SURFACE ANTIGEN: HEP B S AG: NEGATIVE

## 2017-02-18 LAB — OB RESULTS CONSOLE HIV ANTIBODY (ROUTINE TESTING): HIV: NONREACTIVE

## 2017-02-18 LAB — OB RESULTS CONSOLE RPR: RPR: NONREACTIVE

## 2017-02-22 ENCOUNTER — Other Ambulatory Visit: Payer: Self-pay | Admitting: Primary Care

## 2017-02-22 DIAGNOSIS — Z3482 Encounter for supervision of other normal pregnancy, second trimester: Secondary | ICD-10-CM

## 2017-02-26 ENCOUNTER — Ambulatory Visit
Admission: RE | Admit: 2017-02-26 | Discharge: 2017-02-26 | Disposition: A | Discharge status: Home / Self Care | Payer: Medicaid Other | Source: Ambulatory Visit | Attending: Primary Care | Admitting: Primary Care

## 2017-02-26 DIAGNOSIS — O321XX0 Maternal care for breech presentation, not applicable or unspecified: Secondary | ICD-10-CM | POA: Diagnosis not present

## 2017-02-26 DIAGNOSIS — Z3A18 18 weeks gestation of pregnancy: Secondary | ICD-10-CM | POA: Diagnosis not present

## 2017-02-26 DIAGNOSIS — Z3482 Encounter for supervision of other normal pregnancy, second trimester: Secondary | ICD-10-CM

## 2017-03-03 ENCOUNTER — Encounter: Payer: Self-pay | Admitting: Emergency Medicine

## 2017-03-03 ENCOUNTER — Emergency Department
Admission: EM | Admit: 2017-03-03 | Discharge: 2017-03-03 | Disposition: A | Discharge status: Home / Self Care | Payer: Medicaid Other | Attending: Emergency Medicine | Admitting: Emergency Medicine

## 2017-03-03 DIAGNOSIS — R102 Pelvic and perineal pain: Secondary | ICD-10-CM | POA: Insufficient documentation

## 2017-03-03 DIAGNOSIS — R112 Nausea with vomiting, unspecified: Secondary | ICD-10-CM

## 2017-03-03 DIAGNOSIS — Z87891 Personal history of nicotine dependence: Secondary | ICD-10-CM | POA: Insufficient documentation

## 2017-03-03 DIAGNOSIS — Z3A18 18 weeks gestation of pregnancy: Secondary | ICD-10-CM | POA: Insufficient documentation

## 2017-03-03 DIAGNOSIS — K529 Noninfective gastroenteritis and colitis, unspecified: Secondary | ICD-10-CM | POA: Diagnosis not present

## 2017-03-03 DIAGNOSIS — O219 Vomiting of pregnancy, unspecified: Secondary | ICD-10-CM | POA: Diagnosis present

## 2017-03-03 DIAGNOSIS — O99612 Diseases of the digestive system complicating pregnancy, second trimester: Secondary | ICD-10-CM | POA: Insufficient documentation

## 2017-03-03 DIAGNOSIS — R197 Diarrhea, unspecified: Secondary | ICD-10-CM

## 2017-03-03 LAB — URINALYSIS, COMPLETE (UACMP) WITH MICROSCOPIC
BACTERIA UA: NONE SEEN
Bilirubin Urine: NEGATIVE
Glucose, UA: NEGATIVE mg/dL
Hgb urine dipstick: NEGATIVE
KETONES UR: 80 mg/dL — AB
LEUKOCYTES UA: NEGATIVE
NITRITE: NEGATIVE
PROTEIN: 30 mg/dL — AB
SPECIFIC GRAVITY, URINE: 1.026 (ref 1.005–1.030)
pH: 5 (ref 5.0–8.0)

## 2017-03-03 LAB — COMPREHENSIVE METABOLIC PANEL
ALBUMIN: 3.2 g/dL — AB (ref 3.5–5.0)
ALT: 18 U/L (ref 14–54)
AST: 28 U/L (ref 15–41)
Alkaline Phosphatase: 55 U/L (ref 38–126)
Anion gap: 9 (ref 5–15)
BILIRUBIN TOTAL: 0.6 mg/dL (ref 0.3–1.2)
BUN: 5 mg/dL — AB (ref 6–20)
CO2: 20 mmol/L — ABNORMAL LOW (ref 22–32)
Calcium: 9.1 mg/dL (ref 8.9–10.3)
Chloride: 105 mmol/L (ref 101–111)
GLUCOSE: 84 mg/dL (ref 65–99)
POTASSIUM: 3.2 mmol/L — AB (ref 3.5–5.1)
Sodium: 134 mmol/L — ABNORMAL LOW (ref 135–145)
TOTAL PROTEIN: 6.7 g/dL (ref 6.5–8.1)

## 2017-03-03 LAB — CBC
HCT: 37.1 % (ref 35.0–47.0)
Hemoglobin: 11.7 g/dL — ABNORMAL LOW (ref 12.0–16.0)
MCH: 22.1 pg — ABNORMAL LOW (ref 26.0–34.0)
MCHC: 31.6 g/dL — AB (ref 32.0–36.0)
MCV: 69.8 fL — ABNORMAL LOW (ref 80.0–100.0)
PLATELETS: 241 10*3/uL (ref 150–440)
RBC: 5.32 MIL/uL — ABNORMAL HIGH (ref 3.80–5.20)
RDW: 15.2 % — AB (ref 11.5–14.5)
WBC: 11.4 10*3/uL — AB (ref 3.6–11.0)

## 2017-03-03 LAB — HCG, QUANTITATIVE, PREGNANCY: hCG, Beta Chain, Quant, S: 20827 m[IU]/mL — ABNORMAL HIGH (ref <5)

## 2017-03-03 LAB — LIPASE, BLOOD: Lipase: 15 U/L (ref 11–51)

## 2017-03-03 MED ORDER — SODIUM CHLORIDE 0.9 % IV BOLUS (SEPSIS)
1000.0000 mL | Freq: Once | INTRAVENOUS | Status: AC
  Administered 2017-03-03: 1000 mL via INTRAVENOUS

## 2017-03-03 MED ORDER — ONDANSETRON HCL 4 MG PO TABS: 4.0000 mg | ORAL_TABLET | Freq: Three times a day (TID) | ORAL | 0 refills | Status: DC | PRN

## 2017-03-03 MED ORDER — METOCLOPRAMIDE HCL 5 MG/ML IJ SOLN
10.0000 mg | Freq: Once | INTRAMUSCULAR | Status: AC
  Administered 2017-03-03: 10 mg via INTRAVENOUS
  Filled 2017-03-03: qty 2

## 2017-03-03 MED ORDER — ONDANSETRON HCL 4 MG/2ML IJ SOLN
4.0000 mg | Freq: Once | INTRAMUSCULAR | Status: AC
  Administered 2017-03-03: 4 mg via INTRAVENOUS

## 2017-03-03 MED ORDER — ONDANSETRON HCL 4 MG/2ML IJ SOLN
INTRAMUSCULAR | Status: AC
  Administered 2017-03-03: 4 mg via INTRAVENOUS
  Filled 2017-03-03: qty 2

## 2017-03-03 NOTE — ED Notes (Signed)
Pt reports she is unable to void at this time. Reiterated sample is still needed, pt verbalized understanding of this.

## 2017-03-03 NOTE — ED Triage Notes (Signed)
Pt reports vomiting and diarrhea since 0130 yesterday morning. Pt is [redacted] weeks pregnant.

## 2017-03-03 NOTE — ED Provider Notes (Signed)
[facility] Emergency Department Provider Note  ____________________________________________  Time seen: Approximately 8:23 PM  I have reviewed the triage vital signs and the nursing notes.   HISTORY  Chief Complaint Emesis During Pregnancy   HPI Laura Watkins is a 26 y.o. female G4P3 at [redacted] weeks gestational agewith a normal ultrasound done at [redacted]w[redacted]d and a history of anemia who presents for evaluation of vomiting and diarrhea. Patient reports that she had significant nausea and vomiting during this pregnancy however her symptoms had subsided for a few weeks. Since 1 AM this morning patient has had a 3-4 episodes of watery diarrhea and several nonbloody nonbilious emesis. She is also complaining of chills but no fever, no chest pain or shortness of breath. No vaginal discharge, no dysuria, no vaginal bleeding, no contractions. Patient is complaining of mild cramping diffuse abdominal pain that has been present since this morning. Patient denies any known sick contact exposures.  Past Medical History:  Diagnosis Date  . Anemia     Patient Active Problem List   Diagnosis Date Noted  . Normal spontaneous vaginal delivery 06/20/2015    Past Surgical History:  Procedure Laterality Date  . APPENDECTOMY    . COLON SURGERY      Prior to Admission medications   Medication Sig Start Date End Date Taking? Authorizing Provider  HYDROcodone-acetaminophen (NORCO) 5-325 MG tablet Take 1 tablet by mouth every 6 (six) hours as needed for moderate pain. Patient not taking: Reported on 03/03/2017 04/22/16   Irean Hong, MD  ibuprofen (ADVIL,MOTRIN) 800 MG tablet Take 1 tablet (800 mg total) by mouth every 8 (eight) hours as needed for moderate pain. Patient not taking: Reported on 03/03/2017 04/22/16   Irean Hong, MD  lanolin OINT Apply 1 application topically as needed (for breast care). Patient not taking: Reported on 03/03/2017 06/21/15   Christeen Douglas, MD  meloxicam  (MOBIC) 15 MG tablet Take 1 tablet (15 mg total) by mouth daily. Patient not taking: Reported on 03/03/2017 10/22/15   Delorise Royals Cuthriell, PA-C  ondansetron (ZOFRAN) 4 MG tablet Take 1 tablet (4 mg total) by mouth every 8 (eight) hours as needed for nausea or vomiting. 03/03/17   Nita Sickle, MD    Allergies Patient has no known allergies.  No family history on file.  Social History Social History  Substance Use Topics  . Smoking status: Former Games developer  . Smokeless tobacco: Never Used  . Alcohol use No     Comment: occasional    Review of Systems  Constitutional: Negative for fever. Eyes: Negative for visual changes. ENT: Negative for sore throat. Neck: No neck pain  Cardiovascular: Negative for chest pain. Respiratory: Negative for shortness of breath. Gastrointestinal: + diffuse cramping abdominal pain, vomiting and diarrhea. Genitourinary: Negative for dysuria. Musculoskeletal: Negative for back pain. Skin: Negative for rash. Neurological: Negative for headaches, weakness or numbness. Psych: No SI or HI  ____________________________________________   PHYSICAL EXAM:  VITAL SIGNS: ED Triage Vitals [03/03/17 1740]  Enc Vitals Group     BP 129/75     Pulse Rate 99     Resp 20     Temp 98.7 F (37.1 C)     Temp Source Oral     SpO2 98 %     Weight 226 lb (102.5 kg)     Height  (1.6 m)     Head Circumference      Peak Flow      Pain Score 0  Pain Loc      Pain Edu?      Excl. in GC?     Constitutional: Alert and oriented. Well appearing and in no apparent distress. HEENT:      Head: Normocephalic and atraumatic.         Eyes: Conjunctivae are normal. Sclera is non-icteric. EOMI. PERRL      Mouth/Throat: Mucous membranes are moist.       Neck: Supple with no signs of meningismus. Cardiovascular: Regular rate and rhythm. No murmurs, gallops, or rubs. 2+ symmetrical distal pulses are present in all extremities. No JVD. Respiratory: Normal  respiratory effort. Lungs are clear to auscultation bilaterally. No wheezes, crackles, or rhonchi.  Gastrointestinal: Gravid, non tender, and non distended with positive bowel sounds. No rebound or guarding. Musculoskeletal: Nontender with normal range of motion in all extremities. No edema, cyanosis, or erythema of extremities. Neurologic: Normal speech and language. Face is symmetric. Moving all extremities. No gross focal neurologic deficits are appreciated. Skin: Skin is warm, dry and intact. No rash noted. Psychiatric: Mood and affect are normal. Speech and behavior are normal.  ____________________________________________   LABS (all labs ordered are listed, but only abnormal results are displayed)  Labs Reviewed  COMPREHENSIVE METABOLIC PANEL - Abnormal; Notable for the following:       Result Value   Sodium 134 (*)    Potassium 3.2 (*)    CO2 20 (*)    BUN 5 (*)    Creatinine, Ser <0.30 (*)    Albumin 3.2 (*)    All other components within normal limits  CBC - Abnormal; Notable for the following:    WBC 11.4 (*)    RBC 5.32 (*)    Hemoglobin 11.7 (*)    MCV 69.8 (*)    MCH 22.1 (*)    MCHC 31.6 (*)    RDW 15.2 (*)    All other components within normal limits  URINALYSIS, COMPLETE (UACMP) WITH MICROSCOPIC - Abnormal; Notable for the following:    Color, Urine YELLOW (*)    APPearance HAZY (*)    Ketones, ur 80 (*)    Protein, ur 30 (*)    Squamous Epithelial / LPF 6-30 (*)    All other components within normal limits  HCG, QUANTITATIVE, PREGNANCY - Abnormal; Notable for the following:    hCG, Beta Chain, Quant, S 20,827 (*)    All other components within normal limits  LIPASE, BLOOD   ____________________________________________  EKG  none ____________________________________________  RADIOLOGY  none  ____________________________________________   PROCEDURES  Procedure(s) performed: None Procedures Critical Care performed:   None ____________________________________________   INITIAL IMPRESSION / ASSESSMENT AND PLAN / ED COURSE   26 y.o. female G4P3 at [redacted] weeks gestational agewith a normal ultrasound done at [redacted]w[redacted]d and a history of anemia who presents for evaluation of vomiting and diarrhea since 1 AM consistent with a gastroenteritis. Patient has abdominal cramping with a benign abdominal exam. Vital signs are within normal limits. Labs showing normal anion gap, normal CMP and lipase, CBC with mild leukocytosis with white count of 11.4 and stable hemoglobin at 11.7. UA is pending. Fetal heart rate 144. Give IV fluids and anti-medics.  Clinical Course as of Mar 03 2236  Wed Mar 03, 2017  2235 Patient feels markedly improved. Tolerating by mouth. Vital signs within normal limits. We'll discharge home with Zofran and close follow-up with OB/GYN.  [CV]    Clinical Course User Index [CV] Nita Sickle, MD  Pertinent labs & imaging results that were available during my care of the patient were reviewed by me and considered in my medical decision making (see chart for details).    ____________________________________________   FINAL CLINICAL IMPRESSION(S) / ED DIAGNOSES  Final diagnoses:  Nausea vomiting and diarrhea  Gastroenteritis      NEW MEDICATIONS STARTED DURING THIS VISIT:  New Prescriptions   ONDANSETRON (ZOFRAN) 4 MG TABLET    Take 1 tablet (4 mg total) by mouth every 8 (eight) hours as needed for nausea or vomiting.     Note:  This document was prepared using Dragon voice recognition software and may include unintentional dictation errors.    Nita Sickle, MD 03/03/17 2237

## 2017-04-28 ENCOUNTER — Ambulatory Visit: Payer: Self-pay | Admitting: Oncology

## 2017-05-03 ENCOUNTER — Other Ambulatory Visit: Payer: Self-pay | Admitting: Primary Care

## 2017-05-03 DIAGNOSIS — Z3482 Encounter for supervision of other normal pregnancy, second trimester: Secondary | ICD-10-CM

## 2017-05-03 DIAGNOSIS — Z3689 Encounter for other specified antenatal screening: Secondary | ICD-10-CM

## 2017-05-05 DIAGNOSIS — O99013 Anemia complicating pregnancy, third trimester: Secondary | ICD-10-CM | POA: Insufficient documentation

## 2017-05-05 NOTE — Progress Notes (Signed)
[facility]  Telephone:(336) [phone] Fax:(336) [phone]  ID: Carollee SiresKwamecia Yoho OB: 02-27-1991  MR#: [id number]  AOZ#:[id number]#:[id number]  Patient Care Team: [facility] - General (General Practice)  CHIEF COMPLAINT: Anemia affecting pregnancy in the third trimester.  INTERVAL HISTORY: Patient is a 26 year old female who was recently found to have a decreased hemoglobin on routine blood work. She recently entered her third trimester of her fourth pregnancy. She currently feels well. She has persistent weakness and fatigue. She has no neurologic complaints. She is gaining weight appropriately. She denies any recent fevers or illnesses. She has no chest pain or shortness of breath. She denies any nausea, vomiting, constipation, or diarrhea. She has no melena or hematochezia. She has no urinary complaints. Patient offers no further specific complaints.  REVIEW OF SYSTEMS:   Review of Systems  Constitutional: Positive for malaise/fatigue. Negative for fever and weight loss.  Respiratory: Negative.  Negative for cough and shortness of breath.   Cardiovascular: Negative.  Negative for chest pain and leg swelling.  Gastrointestinal: Negative.  Negative for abdominal pain, blood in stool and melena.  Genitourinary: Negative.   Musculoskeletal: Negative.   Skin: Negative.  Negative for rash.  Neurological: Positive for weakness.  Psychiatric/Behavioral: Negative.  The patient is not nervous/anxious.     As per HPI. Otherwise, a complete review of systems is negative.  PAST MEDICAL HISTORY: Past Medical History:  Diagnosis Date  . Anemia     PAST SURGICAL HISTORY: Past Surgical History:  Procedure Laterality Date  . APPENDECTOMY    . COLON SURGERY      FAMILY HISTORY: Reviewed and unchanged.  ADVANCED DIRECTIVES (Y/N):  N  HEALTH MAINTENANCE: Social History  Substance Use Topics  . Smoking status: Former Games developermoker  . Smokeless tobacco: Never  Used  . Alcohol use No     Comment: occasional     Colonoscopy:  PAP:  Bone density:  Lipid panel:  No Known Allergies  No current outpatient prescriptions on file.   No current facility-administered medications for this visit.     OBJECTIVE: Vitals:   05/06/17 1036  BP: 115/73  Pulse: 89  Resp: 20  Temp: 97.9 F (36.6 C)     Body mass index is 40.53 kg/m.    ECOG FS:0 - Asymptomatic  General: Well-developed, well-nourished, no acute distress. Eyes: Pink conjunctiva, anicteric sclera. HEENT: Normocephalic, moist mucous membranes, clear oropharnyx. Lungs: Clear to auscultation bilaterally. Heart: Regular rate and rhythm. No rubs, murmurs, or gallops. Abdomen: Appears appropriate for gestational age. Musculoskeletal: No edema, cyanosis, or clubbing. Neuro: Alert, answering all questions appropriately. Cranial nerves grossly intact. Skin: No rashes or petechiae noted. Psych: Normal affect.  LAB RESULTS:  Lab Results  Component Value Date   NA 134 (L) 03/03/2017   K 3.2 (L) 03/03/2017   CL 105 03/03/2017   CO2 20 (L) 03/03/2017   GLUCOSE 84 03/03/2017   BUN 5 (L) 03/03/2017   CREATININE <0.30 (L) 03/03/2017   CALCIUM 9.1 03/03/2017   PROT 6.7 03/03/2017   ALBUMIN 3.2 (L) 03/03/2017   AST 28 03/03/2017   ALT 18 03/03/2017   ALKPHOS 55 03/03/2017   BILITOT 0.6 03/03/2017   GFRNONAA NOT CALCULATED 03/03/2017   GFRAA NOT CALCULATED 03/03/2017    Lab Results  Component Value Date   WBC 8.1 05/06/2017   NEUTROABS 5.4 05/06/2017   HGB 10.0 (L) 05/06/2017   HCT 30.8 (L) 05/06/2017   MCV 68.7 (L) 05/06/2017   PLT  219 05/06/2017   Lab Results  Component Value Date   IRON 52 05/06/2017   TIBC 533 (H) 05/06/2017   IRONPCTSAT 10 (L) 05/06/2017   Lab Results  Component Value Date   FERRITIN 5 (L) 05/06/2017     STUDIES: No results found.  ASSESSMENT: Anemia affecting pregnancy in the third trimester  PLAN:    1. Anemia affecting pregnancy in the  third trimester: Patient's hemoglobin is decreased to 10.0. Her iron stores are also significantly decreased. She reports she cannot tolerate oral iron supplementation. Patient will benefit from IV iron. Return to clinic in 4 weeks for further evaluation and consideration of 510 mg of IV Feraheme. She will receive a second dose approximately one week later. Will then recheck laboratory work in late October approximately one week prior to her due date to assess if any further interventions are needed. 2. Pregnancy: Patient reports her due date is July 28, 2017. Continue monitoring per OB/GYN.  Patient expressed understanding and was in agreement with this plan. She also understands that She can call clinic at any time with any questions, concerns, or complaints.    Jeralyn Ruths, MD   05/10/2017 1:14 PM

## 2017-05-06 ENCOUNTER — Encounter (INDEPENDENT_AMBULATORY_CARE_PROVIDER_SITE_OTHER): Payer: Self-pay

## 2017-05-06 ENCOUNTER — Inpatient Hospital Stay: Payer: Medicaid Other | Attending: Oncology | Admitting: Oncology

## 2017-05-06 ENCOUNTER — Inpatient Hospital Stay: Payer: Medicaid Other

## 2017-05-06 VITALS — BP 115/73 | HR 89 | Temp 97.9°F | Resp 20 | Wt 228.8 lb

## 2017-05-06 DIAGNOSIS — Z87891 Personal history of nicotine dependence: Secondary | ICD-10-CM | POA: Insufficient documentation

## 2017-05-06 DIAGNOSIS — O99013 Anemia complicating pregnancy, third trimester: Secondary | ICD-10-CM | POA: Insufficient documentation

## 2017-05-06 LAB — CBC WITH DIFFERENTIAL/PLATELET
BASOS ABS: 0 10*3/uL (ref 0–0.1)
BASOS PCT: 0 %
EOS ABS: 0 10*3/uL (ref 0–0.7)
EOS PCT: 1 %
HCT: 30.8 % — ABNORMAL LOW (ref 35.0–47.0)
Hemoglobin: 10 g/dL — ABNORMAL LOW (ref 12.0–16.0)
Lymphocytes Relative: 24 %
Lymphs Abs: 2 10*3/uL (ref 1.0–3.6)
MCH: 22.2 pg — ABNORMAL LOW (ref 26.0–34.0)
MCHC: 32.3 g/dL (ref 32.0–36.0)
MCV: 68.7 fL — AB (ref 80.0–100.0)
MONO ABS: 0.6 10*3/uL (ref 0.2–0.9)
Monocytes Relative: 7 %
Neutro Abs: 5.4 10*3/uL (ref 1.4–6.5)
Neutrophils Relative %: 68 %
PLATELETS: 219 10*3/uL (ref 150–440)
RBC: 4.48 MIL/uL (ref 3.80–5.20)
RDW: 15.2 % — AB (ref 11.5–14.5)
WBC: 8.1 10*3/uL (ref 3.6–11.0)

## 2017-05-06 LAB — IRON AND TIBC
IRON: 52 ug/dL (ref 28–170)
Saturation Ratios: 10 % — ABNORMAL LOW (ref 10.4–31.8)
TIBC: 533 ug/dL — AB (ref 250–450)
UIBC: 481 ug/dL

## 2017-05-06 LAB — FERRITIN: FERRITIN: 5 ng/mL — AB (ref 11–307)

## 2017-05-06 NOTE — Progress Notes (Signed)
Patient here today for initial evaluation regarding anemia in pregnancy.  

## 2017-05-10 ENCOUNTER — Other Ambulatory Visit: Payer: Self-pay | Admitting: *Deleted

## 2017-05-10 DIAGNOSIS — O3663X1 Maternal care for excessive fetal growth, third trimester, fetus 1: Secondary | ICD-10-CM

## 2017-05-11 ENCOUNTER — Ambulatory Visit: Payer: Self-pay

## 2017-05-13 ENCOUNTER — Ambulatory Visit: Payer: Self-pay

## 2017-05-13 ENCOUNTER — Ambulatory Visit
Admission: RE | Admit: 2017-05-13 | Discharge: 2017-05-13 | Disposition: A | Discharge status: Home / Self Care | Payer: Medicaid Other | Source: Ambulatory Visit | Attending: Obstetrics & Gynecology | Admitting: Obstetrics & Gynecology

## 2017-05-13 DIAGNOSIS — Z3689 Encounter for other specified antenatal screening: Secondary | ICD-10-CM | POA: Diagnosis present

## 2017-05-13 DIAGNOSIS — O3663X1 Maternal care for excessive fetal growth, third trimester, fetus 1: Secondary | ICD-10-CM

## 2017-05-14 ENCOUNTER — Inpatient Hospital Stay: Payer: Medicaid Other

## 2017-05-14 VITALS — BP 107/72 | HR 89 | Temp 97.0°F | Resp 18

## 2017-05-14 DIAGNOSIS — O99013 Anemia complicating pregnancy, third trimester: Secondary | ICD-10-CM

## 2017-05-14 MED ORDER — SODIUM CHLORIDE 0.9 % IV SOLN
Freq: Once | INTRAVENOUS | Status: AC
  Administered 2017-05-14: 14:00:00 via INTRAVENOUS
  Filled 2017-05-14: qty 1000

## 2017-05-14 MED ORDER — SODIUM CHLORIDE 0.9 % IV SOLN
510.0000 mg | Freq: Once | INTRAVENOUS | Status: AC
  Administered 2017-05-14: 510 mg via INTRAVENOUS
  Filled 2017-05-14: qty 17

## 2017-05-21 ENCOUNTER — Inpatient Hospital Stay: Payer: Medicaid Other

## 2017-05-21 VITALS — BP 104/71 | HR 97 | Temp 97.4°F | Resp 20

## 2017-05-21 DIAGNOSIS — O99013 Anemia complicating pregnancy, third trimester: Secondary | ICD-10-CM | POA: Diagnosis not present

## 2017-05-21 MED ORDER — SODIUM CHLORIDE 0.9 % IV SOLN
Freq: Once | INTRAVENOUS | Status: AC
  Administered 2017-05-21: 14:00:00 via INTRAVENOUS
  Filled 2017-05-21: qty 1000

## 2017-05-21 MED ORDER — SODIUM CHLORIDE 0.9 % IV SOLN
510.0000 mg | Freq: Once | INTRAVENOUS | Status: AC
  Administered 2017-05-21: 510 mg via INTRAVENOUS
  Filled 2017-05-21: qty 17

## 2017-05-28 ENCOUNTER — Ambulatory Visit: Payer: Medicaid Other

## 2017-06-03 ENCOUNTER — Observation Stay
Admission: EM | Admit: 2017-06-03 | Discharge: 2017-06-03 | Disposition: A | Discharge status: Home / Self Care | Payer: Medicaid Other | Attending: Obstetrics and Gynecology | Admitting: Obstetrics and Gynecology

## 2017-06-03 DIAGNOSIS — O4703 False labor before 37 completed weeks of gestation, third trimester: Secondary | ICD-10-CM | POA: Diagnosis present

## 2017-06-03 DIAGNOSIS — O99013 Anemia complicating pregnancy, third trimester: Secondary | ICD-10-CM

## 2017-06-03 DIAGNOSIS — O4702 False labor before 37 completed weeks of gestation, second trimester: Secondary | ICD-10-CM | POA: Diagnosis not present

## 2017-06-03 LAB — URINE DRUG SCREEN, QUALITATIVE (ARMC ONLY)
Amphetamines, Ur Screen: NOT DETECTED
BARBITURATES, UR SCREEN: NOT DETECTED
BENZODIAZEPINE, UR SCRN: NOT DETECTED
CANNABINOID 50 NG, UR ~~LOC~~: POSITIVE — AB
COCAINE METABOLITE, UR ~~LOC~~: NOT DETECTED
MDMA (Ecstasy)Ur Screen: NOT DETECTED
Methadone Scn, Ur: NOT DETECTED
OPIATE, UR SCREEN: NOT DETECTED
Phencyclidine (PCP) Ur S: NOT DETECTED
TRICYCLIC, UR SCREEN: NOT DETECTED

## 2017-06-03 LAB — URINALYSIS, COMPLETE (UACMP) WITH MICROSCOPIC
BACTERIA UA: NONE SEEN
Bilirubin Urine: NEGATIVE
Glucose, UA: NEGATIVE mg/dL
HGB URINE DIPSTICK: NEGATIVE
KETONES UR: NEGATIVE mg/dL
Leukocytes, UA: NEGATIVE
Nitrite: NEGATIVE
PROTEIN: NEGATIVE mg/dL
Specific Gravity, Urine: 1.023 (ref 1.005–1.030)
pH: 7 (ref 5.0–8.0)

## 2017-06-03 LAB — CBC
HEMATOCRIT: 31.4 % — AB (ref 35.0–47.0)
HEMOGLOBIN: 10 g/dL — AB (ref 12.0–16.0)
MCH: 22.5 pg — ABNORMAL LOW (ref 26.0–34.0)
MCHC: 31.7 g/dL — ABNORMAL LOW (ref 32.0–36.0)
MCV: 70.8 fL — ABNORMAL LOW (ref 80.0–100.0)
Platelets: 168 10*3/uL (ref 150–440)
RBC: 4.43 MIL/uL (ref 3.80–5.20)
RDW: 16.6 % — ABNORMAL HIGH (ref 11.5–14.5)
WBC: 8.2 10*3/uL (ref 3.6–11.0)

## 2017-06-03 LAB — TYPE AND SCREEN
ABO/RH(D): O POS
ANTIBODY SCREEN: NEGATIVE

## 2017-06-03 LAB — FETAL FIBRONECTIN: FETAL FIBRONECTIN: NEGATIVE

## 2017-06-03 LAB — WET PREP, GENITAL
Sperm: NONE SEEN
TRICH WET PREP: NONE SEEN
Yeast Wet Prep HPF POC: NONE SEEN

## 2017-06-03 LAB — BASIC METABOLIC PANEL
ANION GAP: 7 (ref 5–15)
BUN: <5 mg/dL — ABNORMAL LOW (ref 6–20)
CHLORIDE: 105 mmol/L (ref 101–111)
CO2: 24 mmol/L (ref 22–32)
Calcium: 9 mg/dL (ref 8.9–10.3)
Creatinine, Ser: 0.36 mg/dL — ABNORMAL LOW (ref 0.44–1.00)
GFR calc Af Amer: >60 mL/min (ref >60)
GFR calc non Af Amer: >60 mL/min (ref >60)
Glucose, Bld: 74 mg/dL (ref 65–99)
POTASSIUM: 3.6 mmol/L (ref 3.5–5.1)
SODIUM: 136 mmol/L (ref 135–145)

## 2017-06-03 MED ORDER — LACTATED RINGERS IV SOLN
500.0000 mL | INTRAVENOUS | Status: DC | PRN
  Administered 2017-06-03: 1000 mL via INTRAVENOUS

## 2017-06-03 MED ORDER — LACTATED RINGERS IV SOLN
INTRAVENOUS | Status: DC
  Administered 2017-06-03: 14:00:00 via INTRAVENOUS

## 2017-06-03 MED ORDER — METRONIDAZOLE 500 MG PO TABS
500.0000 mg | ORAL_TABLET | Freq: Two times a day (BID) | ORAL | Status: DC
  Administered 2017-06-03: 500 mg via ORAL
  Filled 2017-06-03: qty 1

## 2017-06-03 MED ORDER — METRONIDAZOLE 500 MG PO TABS: 500.0000 mg | ORAL_TABLET | Freq: Two times a day (BID) | ORAL | 0 refills | Status: DC

## 2017-06-03 NOTE — Final Progress Note (Signed)
TRIAGE VISIT with NST   Laura SiresKwamecia Watkins is a 26 y.o. [id number]. She is at 3621w1d gestation, presenting with signs of labor.  Indication: Contractions  S: Resting comfortably. Complains of CTX, no VB. Active fetal movement. O:  BP 115/71 (BP Location: Right Arm)   Pulse 84   Temp 98.2 F (36.8 C) (Oral)   Resp 16   Ht 5\' 3"  (1.6 m)   Wt 227 lb (103 kg)   LMP 10/21/2016   BMI 40.21 kg/m  Results for orders placed or performed during the hospital encounter of 06/03/17 (from the past 48 hour(s))  Wet prep, genital   Collection Time: 06/03/17  1:33 PM  Result Value Ref Range   Yeast Wet Prep HPF POC NONE SEEN NONE SEEN   Trich, Wet Prep NONE SEEN NONE SEEN   Clue Cells Wet Prep HPF POC PRESENT (A) NONE SEEN   WBC, Wet Prep HPF POC FEW (A) NONE SEEN   Sperm NONE SEEN   Urinalysis, Complete w Microscopic   Collection Time: 06/03/17  1:33 PM  Result Value Ref Range   Color, Urine YELLOW (A) YELLOW   APPearance CLEAR (A) CLEAR   Specific Gravity, Urine 1.023 1.005 - 1.030   pH 7.0 5.0 - 8.0   Glucose, UA NEGATIVE NEGATIVE mg/dL   Hgb urine dipstick NEGATIVE NEGATIVE   Bilirubin Urine NEGATIVE NEGATIVE   Ketones, ur NEGATIVE NEGATIVE mg/dL   Protein, ur NEGATIVE NEGATIVE mg/dL   Nitrite NEGATIVE NEGATIVE   Leukocytes, UA NEGATIVE NEGATIVE   RBC / HPF 0-5 0 - 5 RBC/hpf   WBC, UA 0-5 0 - 5 WBC/hpf   Bacteria, UA NONE SEEN NONE SEEN   Squamous Epithelial / LPF 0-5 (A) NONE SEEN   Mucous PRESENT   Urine Drug Screen, Qualitative ([facility] only)   Collection Time: 06/03/17  1:33 PM  Result Value Ref Range   Tricyclic, Ur Screen NONE DETECTED NONE DETECTED   Amphetamines, Ur Screen NONE DETECTED NONE DETECTED   MDMA (Ecstasy)Ur Screen NONE DETECTED NONE DETECTED   Cocaine Metabolite,Ur [place] NONE DETECTED NONE DETECTED   Opiate, Ur Screen NONE DETECTED NONE DETECTED   Phencyclidine (PCP) Ur S NONE DETECTED NONE DETECTED   Cannabinoid 50 Ng, Ur [place] POSITIVE (A) NONE DETECTED   Barbiturates, Ur Screen NONE DETECTED NONE DETECTED   Benzodiazepine, Ur Scrn NONE DETECTED NONE DETECTED   Methadone Scn, Ur NONE DETECTED NONE DETECTED  Type and screen [facility]   Collection Time: 06/03/17  1:39 PM  Result Value Ref Range   ABO/RH(D) O POS    Antibody Screen NEG    Sample Expiration 06/06/2017   CBC   Collection Time: 06/03/17  1:49 PM  Result Value Ref Range   WBC 8.2 3.6 - 11.0 K/uL   RBC 4.43 3.80 - 5.20 MIL/uL   Hemoglobin 10.0 (L) 12.0 - 16.0 g/dL   HCT 45.431.4 (L) 09.835.0 - 11.947.0 %   MCV 70.8 (L) 80.0 - 100.0 fL   MCH 22.5 (L) 26.0 - 34.0 pg   MCHC 31.7 (L) 32.0 - 36.0 g/dL   RDW 14.716.6 (H) 82.911.5 - 56.214.5 %   Platelets 168 150 - 440 K/uL  Basic metabolic panel   Collection Time: 06/03/17  1:49 PM  Result Value Ref Range   Sodium 136 135 - 145 mmol/L   Potassium 3.6 3.5 - 5.1 mmol/L   Chloride 105 101 - 111 mmol/L   CO2 24 22 - 32 mmol/L   Glucose,  Bld 74 65 - 99 mg/dL   BUN <5 (L) 6 - 20 mg/dL   Creatinine, Ser 9.60 (L) 0.44 - 1.00 mg/dL   Calcium 9.0 8.9 - 45.4 mg/dL   GFR calc non Af Amer >60 >60 mL/min   GFR calc Af Amer >60 >60 mL/min   Anion gap 7 5 - 15  Fetal fibronectin   Collection Time: 06/03/17  2:25 PM  Result Value Ref Range   Fetal Fibronectin NEGATIVE NEGATIVE   Appearance, FETFIB CLEAR CLEAR     Gen: NAD, AAOx3      Abd: FNTTP      Ext: Non-tender, Nonedmeatous    NST/FHT: 135, mod var, +accels, no decels TOCO: quiet [id number]: Closed Exam by:: Virgel Manifold, MD  NST: Reactive. See FHT above for particulars.  A/P:  26 y.o. G4P3003 [redacted]w[redacted]d with contractions in preterm.   Labor: not present.   R/o ROM: SSE negative x 3. Wet prep positive for BV. Heavy amount of white discharge. Flagyl script sent home with patient.  Fetal Wellbeing: NST reactive Reassuring Cat 1 tracing.  D/c home stable, precautions reviewed, follow-up as scheduled.

## 2017-06-03 NOTE — Discharge Summary (Signed)
Patient discharged with instructions on follow up appointments, labor precautions, oral hydration, new oral prescription for bacterial vaginosis. Patient verbalized understanding of discharge instructions. Patient am,bualtory at discharge with steady gait and no complaints. Patient discharged with significant other.

## 2017-06-03 NOTE — OB Triage Note (Signed)
[name] here with c/o abdominal pain, reports sharp pain that starts at top of abdomen, shoots down to perineum. Denies bleeding, LOF, reports positive fetal movement.

## 2017-06-17 ENCOUNTER — Ambulatory Visit: Payer: Medicaid Other

## 2017-06-17 ENCOUNTER — Ambulatory Visit: Payer: Medicaid Other | Admitting: Oncology

## 2017-06-17 ENCOUNTER — Other Ambulatory Visit: Payer: Medicaid Other

## 2017-06-30 NOTE — Progress Notes (Signed)
[facility]  Telephone:(336) [phone] Fax:(336) [phone]  ID: Carollee SiresKwamecia Hughston OB: Feb 04, 1991  MR#: [id number]  AOZ#:[id number]#:[id number]  Patient Care Team: [facility] - General (General Practice)  CHIEF COMPLAINT: Anemia affecting pregnancy in the third trimester.  INTERVAL HISTORY: Patient returns to clinic today for repeat laboratory work, further evaluation, and consideration of additional Feraheme. She does not complain of weakness and fatigue today. She currently feels well and is asymptomatic. She has no neurologic complaints. She is gaining weight appropriately. She denies any recent fevers or illnesses. She has no chest pain or shortness of breath. She denies any nausea, vomiting, constipation, or diarrhea. She has no melena or hematochezia. She has no urinary complaints. Patient offers no specific complaints today.  REVIEW OF SYSTEMS:   Review of Systems  Constitutional: Negative for fever, malaise/fatigue and weight loss.  Respiratory: Negative.  Negative for cough and shortness of breath.   Cardiovascular: Negative.  Negative for chest pain and leg swelling.  Gastrointestinal: Negative.  Negative for abdominal pain, blood in stool and melena.  Genitourinary: Negative.   Musculoskeletal: Negative.   Skin: Negative.  Negative for rash.  Neurological: Negative for weakness.  Psychiatric/Behavioral: Negative.  The patient is not nervous/anxious.     As per HPI. Otherwise, a complete review of systems is negative.  PAST MEDICAL HISTORY: Past Medical History:  Diagnosis Date  . Anemia     PAST SURGICAL HISTORY: Past Surgical History:  Procedure Laterality Date  . APPENDECTOMY    . COLON SURGERY      FAMILY HISTORY: Reviewed and unchanged.  ADVANCED DIRECTIVES (Y/N):  N  HEALTH MAINTENANCE: Social History  Substance Use Topics  . Smoking status: Former Games developermoker  . Smokeless tobacco: Never Used  . Alcohol use No   Comment: occasional     Colonoscopy:  PAP:  Bone density:  Lipid panel:  No Known Allergies  Current Outpatient Prescriptions  Medication Sig Dispense Refill  . ferrous sulfate 325 (65 FE) MG tablet Take 325 mg by mouth daily with breakfast.    . Prenatal Vit-Fe Fumarate-FA (PRENATAL MULTIVITAMIN) TABS tablet Take 1 tablet by mouth daily at 12 noon.     No current facility-administered medications for this visit.     OBJECTIVE: Vitals:   07/01/17 1431  BP: (!) 144/81  Pulse: (!) 102  Resp: 20  Temp: (!) 97.2 F (36.2 C)     Body mass index is 41.56 kg/m.    ECOG FS:0 - Asymptomatic  General: Well-developed, well-nourished, no acute distress. Eyes: Pink conjunctiva, anicteric sclera. Lungs: Clear to auscultation bilaterally. Heart: Regular rate and rhythm. No rubs, murmurs, or gallops. Abdomen: Appears appropriate for gestational age. Musculoskeletal: No edema, cyanosis, or clubbing. Neuro: Alert, answering all questions appropriately. Cranial nerves grossly intact. Skin: No rashes or petechiae noted. Psych: Normal affect.  LAB RESULTS:  Lab Results  Component Value Date   NA 136 06/03/2017   K 3.6 06/03/2017   CL 105 06/03/2017   CO2 24 06/03/2017   GLUCOSE 74 06/03/2017   BUN <5 (L) 06/03/2017   CREATININE 0.36 (L) 06/03/2017   CALCIUM 9.0 06/03/2017   PROT 6.7 03/03/2017   ALBUMIN 3.2 (L) 03/03/2017   AST 28 03/03/2017   ALT 18 03/03/2017   ALKPHOS 55 03/03/2017   BILITOT 0.6 03/03/2017   GFRNONAA >60 06/03/2017   GFRAA >60 06/03/2017    Lab Results  Component Value Date   WBC 8.4 07/01/2017   NEUTROABS 5.9 07/01/2017  HGB 10.9 (L) 07/01/2017   HCT 34.2 (L) 07/01/2017   MCV 71.6 (L) 07/01/2017   PLT 205 07/01/2017   Lab Results  Component Value Date   IRON 81 07/01/2017   TIBC 475 (H) 07/01/2017   IRONPCTSAT 17 07/01/2017   Lab Results  Component Value Date   FERRITIN 35 07/01/2017     STUDIES: No results found.  ASSESSMENT:  Anemia affecting pregnancy in the third trimester  PLAN:    1. Anemia affecting pregnancy in the third trimester: Patient's hemoglobin Has significantly improved and is now greater than 10.0. Her iron stores are within normal limits. She reports she cannot tolerate oral iron supplementation. She does not require additional IV iron today. Return to clinic in 4 months, which will be approximately 3 months postpartum for repeat laboratory work and further evaluation.  2. Pregnancy: Patient reports her due date is July 28, 2017. Continue monitoring per OB/GYN.  Patient expressed understanding and was in agreement with this plan. She also understands that She can call clinic at any time with any questions, concerns, or complaints.    Jeralyn Ruths, MD   07/04/2017 9:31 AM

## 2017-07-01 ENCOUNTER — Inpatient Hospital Stay: Payer: Medicaid Other

## 2017-07-01 ENCOUNTER — Other Ambulatory Visit: Payer: Medicaid Other

## 2017-07-01 ENCOUNTER — Ambulatory Visit: Payer: Medicaid Other | Admitting: Oncology

## 2017-07-01 ENCOUNTER — Ambulatory Visit: Payer: Medicaid Other

## 2017-07-01 ENCOUNTER — Encounter (INDEPENDENT_AMBULATORY_CARE_PROVIDER_SITE_OTHER): Payer: Self-pay

## 2017-07-01 ENCOUNTER — Inpatient Hospital Stay (HOSPITAL_BASED_OUTPATIENT_CLINIC_OR_DEPARTMENT_OTHER): Payer: Medicaid Other | Admitting: Oncology

## 2017-07-01 ENCOUNTER — Other Ambulatory Visit: Payer: Self-pay

## 2017-07-01 ENCOUNTER — Inpatient Hospital Stay: Payer: Medicaid Other | Attending: Oncology

## 2017-07-01 VITALS — BP 144/81 | HR 102 | Temp 97.2°F | Resp 20 | Wt 234.6 lb

## 2017-07-01 DIAGNOSIS — D509 Iron deficiency anemia, unspecified: Secondary | ICD-10-CM | POA: Diagnosis not present

## 2017-07-01 DIAGNOSIS — O99013 Anemia complicating pregnancy, third trimester: Secondary | ICD-10-CM | POA: Diagnosis present

## 2017-07-01 DIAGNOSIS — Z79899 Other long term (current) drug therapy: Secondary | ICD-10-CM | POA: Insufficient documentation

## 2017-07-01 DIAGNOSIS — Z87891 Personal history of nicotine dependence: Secondary | ICD-10-CM | POA: Insufficient documentation

## 2017-07-01 LAB — IRON AND TIBC
IRON: 81 ug/dL (ref 28–170)
SATURATION RATIOS: 17 % (ref 10.4–31.8)
TIBC: 475 ug/dL — AB (ref 250–450)
UIBC: 394 ug/dL

## 2017-07-01 LAB — CBC WITH DIFFERENTIAL/PLATELET
BASOS ABS: 0 10*3/uL (ref 0–0.1)
BASOS PCT: 0 %
EOS ABS: 0 10*3/uL (ref 0–0.7)
Eosinophils Relative: 0 %
HCT: 34.2 % — ABNORMAL LOW (ref 35.0–47.0)
HEMOGLOBIN: 10.9 g/dL — AB (ref 12.0–16.0)
LYMPHS ABS: 2 10*3/uL (ref 1.0–3.6)
Lymphocytes Relative: 24 %
MCH: 22.8 pg — AB (ref 26.0–34.0)
MCHC: 31.8 g/dL — ABNORMAL LOW (ref 32.0–36.0)
MCV: 71.6 fL — ABNORMAL LOW (ref 80.0–100.0)
Monocytes Absolute: 0.5 10*3/uL (ref 0.2–0.9)
Monocytes Relative: 7 %
NEUTROS PCT: 69 %
Neutro Abs: 5.9 10*3/uL (ref 1.4–6.5)
PLATELETS: 205 10*3/uL (ref 150–440)
RBC: 4.77 MIL/uL (ref 3.80–5.20)
RDW: 16.5 % — ABNORMAL HIGH (ref 11.5–14.5)
WBC: 8.4 10*3/uL (ref 3.6–11.0)

## 2017-07-01 LAB — FERRITIN: FERRITIN: 35 ng/mL (ref 11–307)

## 2017-07-01 NOTE — Progress Notes (Signed)
Patient denies any concerns today, expected delivery is in 4 weeks.

## 2017-07-31 ENCOUNTER — Inpatient Hospital Stay: Payer: Medicaid Other | Admitting: Anesthesiology

## 2017-07-31 ENCOUNTER — Encounter: Payer: Self-pay | Admitting: *Deleted

## 2017-07-31 ENCOUNTER — Inpatient Hospital Stay
Admission: EM | Admit: 2017-07-31 | Discharge: 2017-08-02 | DRG: 775 | Disposition: A | Discharge status: Home / Self Care | Payer: Medicaid Other | Attending: Obstetrics and Gynecology | Admitting: Obstetrics and Gynecology

## 2017-07-31 DIAGNOSIS — Z3490 Encounter for supervision of normal pregnancy, unspecified, unspecified trimester: Secondary | ICD-10-CM

## 2017-07-31 DIAGNOSIS — O9902 Anemia complicating childbirth: Secondary | ICD-10-CM | POA: Diagnosis present

## 2017-07-31 DIAGNOSIS — Z87891 Personal history of nicotine dependence: Secondary | ICD-10-CM

## 2017-07-31 DIAGNOSIS — Z3A40 40 weeks gestation of pregnancy: Secondary | ICD-10-CM

## 2017-07-31 DIAGNOSIS — O43123 Velamentous insertion of umbilical cord, third trimester: Secondary | ICD-10-CM | POA: Diagnosis present

## 2017-07-31 DIAGNOSIS — D649 Anemia, unspecified: Secondary | ICD-10-CM | POA: Diagnosis present

## 2017-07-31 DIAGNOSIS — Z6841 Body Mass Index (BMI) 40.0 and over, adult: Secondary | ICD-10-CM

## 2017-07-31 DIAGNOSIS — O99214 Obesity complicating childbirth: Secondary | ICD-10-CM | POA: Diagnosis present

## 2017-07-31 DIAGNOSIS — Z3493 Encounter for supervision of normal pregnancy, unspecified, third trimester: Secondary | ICD-10-CM | POA: Diagnosis present

## 2017-07-31 LAB — URINE DRUG SCREEN, QUALITATIVE (ARMC ONLY)
AMPHETAMINES, UR SCREEN: NOT DETECTED
BENZODIAZEPINE, UR SCRN: NOT DETECTED
Barbiturates, Ur Screen: NOT DETECTED
CANNABINOID 50 NG, UR ~~LOC~~: POSITIVE — AB
Cocaine Metabolite,Ur ~~LOC~~: NOT DETECTED
MDMA (ECSTASY) UR SCREEN: NOT DETECTED
Methadone Scn, Ur: NOT DETECTED
Opiate, Ur Screen: NOT DETECTED
Phencyclidine (PCP) Ur S: NOT DETECTED
TRICYCLIC, UR SCREEN: NOT DETECTED

## 2017-07-31 LAB — CBC
HCT: 36.7 % (ref 35.0–47.0)
HEMOGLOBIN: 11.9 g/dL — AB (ref 12.0–16.0)
MCH: 23.1 pg — AB (ref 26.0–34.0)
MCHC: 32.4 g/dL (ref 32.0–36.0)
MCV: 71.4 fL — ABNORMAL LOW (ref 80.0–100.0)
PLATELETS: 187 10*3/uL (ref 150–440)
RBC: 5.15 MIL/uL (ref 3.80–5.20)
RDW: 15.5 % — ABNORMAL HIGH (ref 11.5–14.5)
WBC: 11 10*3/uL (ref 3.6–11.0)

## 2017-07-31 LAB — TYPE AND SCREEN
ABO/RH(D): O POS
ANTIBODY SCREEN: NEGATIVE

## 2017-07-31 MED ORDER — OXYCODONE HCL 5 MG PO TABS
5.0000 mg | ORAL_TABLET | ORAL | Status: DC | PRN
  Administered 2017-08-01 (×3): 5 mg via ORAL
  Filled 2017-07-31 (×3): qty 1

## 2017-07-31 MED ORDER — AMMONIA AROMATIC IN INHA
RESPIRATORY_TRACT | Status: AC
  Filled 2017-07-31: qty 10

## 2017-07-31 MED ORDER — OXYTOCIN BOLUS FROM INFUSION
500.0000 mL | Freq: Once | INTRAVENOUS | Status: AC
  Administered 2017-07-31: 500 mL via INTRAVENOUS

## 2017-07-31 MED ORDER — TERBUTALINE SULFATE 1 MG/ML IJ SOLN: 0.2500 mg | Freq: Once | INTRAMUSCULAR | Status: DC | PRN

## 2017-07-31 MED ORDER — FLEET ENEMA 7-19 GM/118ML RE ENEM: 1.0000 | ENEMA | Freq: Every day | RECTAL | Status: DC | PRN

## 2017-07-31 MED ORDER — LACTATED RINGERS IV SOLN
500.0000 mL | INTRAVENOUS | Status: DC | PRN
  Administered 2017-07-31: 500 mL via INTRAVENOUS

## 2017-07-31 MED ORDER — IBUPROFEN 600 MG PO TABS
600.0000 mg | ORAL_TABLET | Freq: Four times a day (QID) | ORAL | Status: DC
  Administered 2017-08-01 (×4): 600 mg via ORAL
  Filled 2017-07-31 (×4): qty 1

## 2017-07-31 MED ORDER — PRENATAL MULTIVITAMIN CH
1.0000 | ORAL_TABLET | Freq: Every day | ORAL | Status: DC
  Administered 2017-08-01: 1 via ORAL
  Filled 2017-07-31: qty 1

## 2017-07-31 MED ORDER — OXYTOCIN 40 UNITS IN LACTATED RINGERS INFUSION - SIMPLE MED: 2.5000 [IU]/h | INTRAVENOUS | Status: DC

## 2017-07-31 MED ORDER — PHENYLEPHRINE 40 MCG/ML (10ML) SYRINGE FOR IV PUSH (FOR BLOOD PRESSURE SUPPORT)
80.0000 ug | PREFILLED_SYRINGE | INTRAVENOUS | Status: DC | PRN
  Filled 2017-07-31: qty 5

## 2017-07-31 MED ORDER — ONDANSETRON HCL 4 MG/2ML IJ SOLN
4.0000 mg | Freq: Four times a day (QID) | INTRAMUSCULAR | Status: DC | PRN
  Administered 2017-07-31: 4 mg via INTRAVENOUS
  Filled 2017-07-31: qty 2

## 2017-07-31 MED ORDER — EPHEDRINE 5 MG/ML INJ
10.0000 mg | INTRAVENOUS | Status: DC | PRN
  Filled 2017-07-31: qty 2

## 2017-07-31 MED ORDER — ZOLPIDEM TARTRATE 5 MG PO TABS: 5.0000 mg | ORAL_TABLET | Freq: Every evening | ORAL | Status: DC | PRN

## 2017-07-31 MED ORDER — SIMETHICONE 80 MG PO CHEW: 80.0000 mg | CHEWABLE_TABLET | ORAL | Status: DC | PRN

## 2017-07-31 MED ORDER — LACTATED RINGERS IV SOLN: 500.0000 mL | Freq: Once | INTRAVENOUS | Status: DC

## 2017-07-31 MED ORDER — SODIUM CHLORIDE 0.9 % IV SOLN: 250.0000 mL | INTRAVENOUS | Status: DC | PRN

## 2017-07-31 MED ORDER — ACETAMINOPHEN 325 MG PO TABS
650.0000 mg | ORAL_TABLET | ORAL | Status: DC | PRN
  Administered 2017-08-01: 650 mg via ORAL
  Filled 2017-07-31: qty 2

## 2017-07-31 MED ORDER — MISOPROSTOL 200 MCG PO TABS
ORAL_TABLET | ORAL | Status: AC
  Filled 2017-07-31: qty 4

## 2017-07-31 MED ORDER — MEDROXYPROGESTERONE ACETATE 150 MG/ML IM SUSP
150.0000 mg | INTRAMUSCULAR | Status: AC | PRN
  Administered 2017-08-01: 150 mg via INTRAMUSCULAR

## 2017-07-31 MED ORDER — BISACODYL 10 MG RE SUPP: 10.0000 mg | Freq: Every day | RECTAL | Status: DC | PRN

## 2017-07-31 MED ORDER — ONDANSETRON HCL 4 MG/2ML IJ SOLN: 4.0000 mg | INTRAMUSCULAR | Status: DC | PRN

## 2017-07-31 MED ORDER — FENTANYL 2.5 MCG/ML W/ROPIVACAINE 0.15% IN NS 100 ML EPIDURAL (ARMC)
12.0000 mL/h | EPIDURAL | Status: DC
  Administered 2017-07-31: 12 mL/h via EPIDURAL

## 2017-07-31 MED ORDER — ONDANSETRON HCL 4 MG PO TABS: 4.0000 mg | ORAL_TABLET | ORAL | Status: DC | PRN

## 2017-07-31 MED ORDER — SODIUM CHLORIDE 0.9% FLUSH: 3.0000 mL | INTRAVENOUS | Status: DC | PRN

## 2017-07-31 MED ORDER — SODIUM CHLORIDE 0.9% FLUSH: 3.0000 mL | Freq: Two times a day (BID) | INTRAVENOUS | Status: DC

## 2017-07-31 MED ORDER — OXYTOCIN 40 UNITS IN LACTATED RINGERS INFUSION - SIMPLE MED
1.0000 m[IU]/min | INTRAVENOUS | Status: DC
  Administered 2017-07-31: 1 m[IU]/min via INTRAVENOUS
  Filled 2017-07-31: qty 1000

## 2017-07-31 MED ORDER — LACTATED RINGERS IV SOLN
INTRAVENOUS | Status: DC
  Administered 2017-07-31: 15:00:00 via INTRAVENOUS

## 2017-07-31 MED ORDER — SENNOSIDES-DOCUSATE SODIUM 8.6-50 MG PO TABS
2.0000 | ORAL_TABLET | ORAL | Status: DC
  Administered 2017-08-01: 2 via ORAL
  Filled 2017-07-31: qty 2

## 2017-07-31 MED ORDER — MEASLES, MUMPS & RUBELLA VAC ~~LOC~~ INJ
0.5000 mL | INJECTION | Freq: Once | SUBCUTANEOUS | Status: DC
  Filled 2017-07-31: qty 0.5

## 2017-07-31 MED ORDER — COCONUT OIL OIL: 1.0000 "application " | TOPICAL_OIL | Status: DC | PRN

## 2017-07-31 MED ORDER — LIDOCAINE-EPINEPHRINE (PF) 1.5 %-1:200000 IJ SOLN
INTRAMUSCULAR | Status: DC | PRN
  Administered 2017-07-31: 3 mL via EPIDURAL

## 2017-07-31 MED ORDER — TETANUS-DIPHTH-ACELL PERTUSSIS 5-2.5-18.5 LF-MCG/0.5 IM SUSP: 0.5000 mL | Freq: Once | INTRAMUSCULAR | Status: DC

## 2017-07-31 MED ORDER — OXYTOCIN 10 UNIT/ML IJ SOLN
INTRAMUSCULAR | Status: AC
  Filled 2017-07-31: qty 2

## 2017-07-31 MED ORDER — LIDOCAINE HCL (PF) 1 % IJ SOLN
30.0000 mL | INTRAMUSCULAR | Status: DC | PRN
  Filled 2017-07-31: qty 30

## 2017-07-31 MED ORDER — FENTANYL 2.5 MCG/ML W/ROPIVACAINE 0.15% IN NS 100 ML EPIDURAL (ARMC)
EPIDURAL | Status: AC
  Filled 2017-07-31: qty 100

## 2017-07-31 MED ORDER — WITCH HAZEL-GLYCERIN EX PADS: 1.0000 "application " | MEDICATED_PAD | CUTANEOUS | Status: DC | PRN

## 2017-07-31 MED ORDER — DIPHENHYDRAMINE HCL 50 MG/ML IJ SOLN: 12.5000 mg | INTRAMUSCULAR | Status: DC | PRN

## 2017-07-31 MED ORDER — DIBUCAINE 1 % RE OINT: 1.0000 "application " | TOPICAL_OINTMENT | RECTAL | Status: DC | PRN

## 2017-07-31 MED ORDER — DIPHENHYDRAMINE HCL 25 MG PO CAPS: 25.0000 mg | ORAL_CAPSULE | Freq: Four times a day (QID) | ORAL | Status: DC | PRN

## 2017-07-31 MED ORDER — BUPIVACAINE HCL (PF) 0.25 % IJ SOLN
INTRAMUSCULAR | Status: DC | PRN
  Administered 2017-07-31 (×2): 5 mL via EPIDURAL

## 2017-07-31 MED ORDER — LIDOCAINE HCL (PF) 1 % IJ SOLN
INTRAMUSCULAR | Status: DC | PRN
  Administered 2017-07-31: 1 mL via INTRADERMAL

## 2017-07-31 MED ORDER — BENZOCAINE-MENTHOL 20-0.5 % EX AERO: 1.0000 "application " | INHALATION_SPRAY | CUTANEOUS | Status: DC | PRN

## 2017-07-31 NOTE — Discharge Summary (Signed)
Obstetrical Discharge Summary  Patient Name: Laura Watkins DOB: 1991-01-19 MRN: [id number]  Date of Admission: 07/31/2017 Date of Discharge: 08/01/17 Primary OB: [facility]  Gestational Age at Delivery: 3273w3d   Antepartum complications:  - anemia - prior postpartum hemorrhage  Admitting Diagnosis: Active labor Secondary Diagnosis: Patient Active Problem List   Diagnosis Date Noted  . Pregnancy 07/31/2017  . Preterm uterine contractions in third trimester, antepartum 06/03/2017  . Anemia affecting pregnancy in third trimester 05/05/2017  . Normal spontaneous vaginal delivery 06/20/2015    Augmentation: AROM and Pitocin Complications: None Intrapartum complications/course:  26yo [id number] at 40+3wks admitted in active labor. AROM for clear fluid. Pitocin of 3 from 6 to 10 cm, and baby delivered with several pushes in ROA position with nuchal cord that was delivered through. FOB cut cord on maternal abdomen after 60sec delayed cord clamping. Placenta delivered spontaneously and was intact. Minimal bleeding. Cord was inserted at the very margin, with membranes surrounding. No tears in cord or membranes.  I also delivered their last baby boy 2 yrs ago "Laura Watkins"   Bottle feeding, depo shot, with planned vasectomy.  Date of Delivery: 07/31/16 Delivered By: Christeen DouglasBethany Beasley Delivery Type: spontaneous vaginal delivery Anesthesia: epidural Placenta: Spontaneous Laceration: none Episiotomy: none Newborn Data: Live born female "Laura Watkins" Birth Weight:  Pending APGAR:8 , 9    Discharge Physical Exam: 08/01/17 BP 128/77 (BP Location: Left Arm)   Pulse 69   Temp 98.1 F (36.7 C) (Oral)   Resp (!) 22   Ht 5\' 3"  (1.6 m)   Wt 108.3 kg (238 lb 12.8 oz)   LMP 10/21/2016   SpO2 99%   Breastfeeding? Unknown   BMI 42.30 kg/m   General: NAD CV: RRR Pulm: CTABL, nl effort ABD: s/nd/nt, fundus firm and below the umbilicus Lochia: moderate Incision:n/a DVT Evaluation: LE non-ttp, no evidence of  DVT on exam.  Hemoglobin  Date Value Ref Range Status  08/01/2017 11.6 (L) 12.0 - 16.0 g/dL Final   HCT  Date Value Ref Range Status  08/01/2017 35.4 35.0 - 47.0 % Final    Post partum course: uncomplicated  Postpartum Procedures: none Disposition: stable, discharge to home. Baby Feeding: formula Baby Disposition: home with mom  Rh Immune globulin given: n/a Rubella vaccine given:  Tdap vaccine given in AP or PP setting:  Flu vaccine given in AP or PP setting:   Contraception: Depo  Prenatal Labs:   ABO, Rh: --/--/O POS (07/12 1339) Antibody: NEG (07/12 1339) RPR: Nonreactive (03/29 0000)  HBsAg: Negative (03/29 0000)  HIV: Non-reactive (03/29 0000)  GTT: 76 GBS:   negative  Plan:  Laura Watkins was discharged to home in good condition. [facility] with delivering provider 150 mg Depo provera given before d/c  Discharge Medications: Allergies as of 08/01/2017   No Known Allergies     Medication List    TAKE these medications   ferrous sulfate 325 (65 FE) MG tablet Take 325 mg by mouth daily with breakfast.   ibuprofen 600 MG tablet Commonly known as:  ADVIL,MOTRIN Take 1 tablet (600 mg total) by mouth every 6 (six) hours.   prenatal multivitamin Tabs tablet Take 1 tablet by mouth daily at 12 noon.            Discharge Care Instructions        Start     Ordered   08/01/17 0000  ibuprofen (ADVIL,MOTRIN) 600 MG tablet  Every 6 hours  08/01/17 1224   08/01/17 0000  Diet - low sodium heart healthy     08/01/17 1224   08/01/17 0000  Call MD for:  extreme fatigue     08/01/17 1224   08/01/17 0000  Call MD for:  persistant dizziness or light-headedness     08/01/17 1224   08/01/17 0000  Call MD for:  hives     08/01/17 1224   08/01/17 0000  Call MD for:  difficulty breathing, headache or visual disturbances     08/01/17 1224   08/01/17 0000  Call MD for:  redness, tenderness, or signs of infection  (pain, swelling, redness, odor or green/yellow discharge around incision site)     08/01/17 1224   08/01/17 0000  Call MD for:  severe uncontrolled pain     08/01/17 1224   08/01/17 0000  Call MD for:  persistant nausea and vomiting     08/01/17 1224   08/01/17 0000  Call MD for:  temperature >100.4     08/01/17 1224   07/31/17 0000  OB RESULTS CONSOLE GC/Chlamydia    Comments:  This external order was created through the Results Console.   07/31/17 1337   07/31/17 0000  OB RESULTS CONSOLE RPR    Comments:  This external order was created through the Results Console.    07/31/17 1337   07/31/17 0000  OB RESULTS CONSOLE HIV antibody    Comments:  This external order was created through the Results Console.    07/31/17 1337   07/31/17 0000  OB RESULTS CONSOLE Hepatitis B surface antigen    Comments:  This external order was created through the Results Console.    07/31/17 1337      Follow-up Information    Christeen Douglas, MD Follow up in 6 week(s).   Specialty:  Obstetrics and Gynecology Contact information: [address] [place] [address] [phone]           Signed: Ihor Austin Schermerhorn MD

## 2017-07-31 NOTE — Progress Notes (Signed)
Carollee SiresKwamecia Leatherbury is a 26 y.o. [phone] at 1143w3d with advanced dilation  Subjective: Comfy with epidural  Objective: BP (!) 128/55   Pulse (!) 104   Temp 99 F (37.2 C) (Oral)   Resp 16   Ht 5\' 3"  (1.6 m)   Wt 238 lb 12.8 oz (108.3 kg)   LMP 10/21/2016   SpO2 100%   BMI 42.30 kg/m  No intake/output data recorded. No intake/output data recorded.  FHT:  FHR: 130 bpm, variability: moderate,  accelerations:  Present,  decelerations:  Absent UC:   regular, every 2-4 minutes SVE:   Dilation: 5 Effacement (%): 60 Station: Ballotable Exam by:: Dr.Colvin Blatt  Labs: Lab Results  Component Value Date   WBC 11.0 07/31/2017   HGB 11.9 (L) 07/31/2017   HCT 36.7 07/31/2017   MCV 71.4 (L) 07/31/2017   PLT 187 07/31/2017    Assessment / Plan: Active labor, s/p AROM without change in cervix x2 hrs. Will start minimal pitocin for augmentation. Anticipate vaginal delivery  Christeen DouglasBethany Banesa Tristan 07/31/2017, 4:45 PM

## 2017-07-31 NOTE — Anesthesia Preprocedure Evaluation (Signed)
Anesthesia Evaluation  Patient identified by MRN, date of birth, ID band Patient awake    Reviewed: Allergy & Precautions, H&P , NPO status , Patient's Chart, lab work & pertinent test results  History of Anesthesia Complications (+) history of anesthetic complications (Last epidural did not work per patient)  Airway Mallampati: III  TM Distance: >3 FB Neck ROM: full    Dental  (+) Poor Dentition   Pulmonary former smoker,           Cardiovascular Exercise Tolerance: Good (-) hypertensionnegative cardio ROS       Neuro/Psych    GI/Hepatic negative GI ROS,   Endo/Other  Morbid obesity  Renal/GU   negative genitourinary   Musculoskeletal   Abdominal   Peds  Hematology negative hematology ROS (+)   Anesthesia Other Findings Past Medical History: No date: Anemia  Past Surgical History: No date: APPENDECTOMY No date: COLON SURGERY  BMI    Body Mass Index:  42.30 kg/m      Reproductive/Obstetrics (+) Pregnancy                             Anesthesia Physical Anesthesia Plan  ASA: III  Anesthesia Plan: Epidural   Post-op Pain Management:    Induction:   PONV Risk Score and Plan:   Airway Management Planned:   Additional Equipment:   Intra-op Plan:   Post-operative Plan:   Informed Consent: I have reviewed the patients History and Physical, chart, labs and discussed the procedure including the risks, benefits and alternatives for the proposed anesthesia with the patient or authorized representative who has indicated his/her understanding and acceptance.     Plan Discussed with: Anesthesiologist  Anesthesia Plan Comments:         Anesthesia Quick Evaluation

## 2017-07-31 NOTE — Anesthesia Procedure Notes (Signed)
Epidural Patient location during procedure: OB Start time: 07/31/2017 3:48 PM End time: 07/31/2017 3:51 PM  Staffing Anesthesiologist: Margorie JohnPISCITELLO, JOSEPH K Performed: anesthesiologist   Preanesthetic Checklist Completed: patient identified, site marked, surgical consent, pre-op evaluation, timeout performed, IV checked, risks and benefits discussed and monitors and equipment checked  Epidural Patient position: sitting Prep: Betadine Patient monitoring: heart rate, continuous pulse ox and blood pressure Approach: midline Location: L3-L4 Injection technique: LOR saline  Needle:  Needle type: Tuohy  Needle gauge: 17 G Needle length: 9 cm and 9 Needle insertion depth: 5.5 cm Catheter type: closed end flexible Catheter size: 19 Gauge Catheter at skin depth: 10.5 cm Test dose: negative and 1.5% lidocaine with Epi 1:200 K  Assessment Sensory level: T10 Events: blood not aspirated, injection not painful, no injection resistance, negative IV test and no paresthesia  Additional Notes 1 Attempt Pt. Evaluated and documentation done after procedure finished. Patient identified. Risks/Benefits/Options discussed with patient including but not limited to bleeding, infection, nerve damage, paralysis, failed block, incomplete pain control, headache, blood pressure changes, nausea, vomiting, reactions to medication both or allergic, itching and postpartum back pain. Confirmed with bedside nurse the patient's most recent platelet count. Confirmed with patient that they are not currently taking any anticoagulation, have any bleeding history or any family history of bleeding disorders. Patient expressed understanding and wished to proceed. All questions were answered. Sterile technique was used throughout the entire procedure. Please see nursing notes for vital signs. Test dose was given through epidural catheter and negative prior to continuing to dose epidural or start infusion. Warning signs of high  block given to the patient including shortness of breath, tingling/numbness in hands, complete motor block, or any concerning symptoms with instructions to call for help. Patient was given instructions on fall risk and not to get out of bed. All questions and concerns addressed with instructions to call with any issues or inadequate analgesia.   Patient tolerated the insertion well without immediate complications.Reason for block:procedure for pain

## 2017-07-31 NOTE — H&P (Signed)
OB ADMISSION/ HISTORY & PHYSICAL:  Admission Date: 07/31/2017 12:32 PM  Admit Diagnosis: Active labor at term  Laura Watkins is a 26 y.o. female [phone] at 40+3 presenting for active labor.  Prenatal History: [id number]   EDC : 07/28/2017, Date entered prior to episode creation  Prenatal care at Primary Ob Provider: Phineas Realharles Drew Prenatal course complicated by  - anemia - hx of pelvic laparoscopy - hx of appendectomy  Prenatal Labs: ABO, Rh: --/--/O POS (07/12 1339) Antibody: NEG (07/12 1339) Rubella:    RPR: Nonreactive (03/29 0000)  HBsAg: Negative (03/29 0000)  HIV: Non-reactive (03/29 0000)  GTT: 76 GBS:   negative  Medical / Surgical History :  Past medical history:  Past Medical History:  Diagnosis Date  . Anemia      Past surgical history:  Past Surgical History:  Procedure Laterality Date  . APPENDECTOMY    . COLON SURGERY      Family History: History reviewed. No pertinent family history.   Social History:  reports that she has quit smoking. She has never used smokeless tobacco. She reports that she does not drink alcohol or use drugs.   Allergies: Patient has no known allergies.    Current Medications at time of admission:  Prior to Admission medications   Medication Sig Start Date End Date Taking? Authorizing Provider  ferrous sulfate 325 (65 FE) MG tablet Take 325 mg by mouth daily with breakfast.   Yes [provider]  Prenatal Vit-Fe Fumarate-FA (PRENATAL MULTIVITAMIN) TABS tablet Take 1 tablet by mouth daily at 12 noon.   Yes [provider]     Review of Systems: Active FM onset of ctx @ 1200 currently every 2-3 minutes AROM at 1515 bloody show - no   Physical Exam:  VS: Blood pressure 133/77, pulse 98, temperature 98.8 F (37.1 C), temperature source Oral, resp. rate 16, height 5\' 3"  (1.6 m), weight 238 lb 12.8 oz (108.3 kg), last menstrual period 10/21/2016.  General: alert and oriented, appears flat affect Heart:  RRR Lungs: Clear lung fields Abdomen: Gravid, soft and non-tender, non-distended / uterus: non tender Extremities: 1+ edema  Genitalia / VE: Dilation: 5 Effacement (%): 60 Station: -2, -3 Exam by:: Indi Willhite  FHR: baseline rate 140 / variability mod / accelerations + / no decelerations TOCO: q2-3  Last US Placenta normal, AF wnl, normal anatomy  Assessment: 40+[redacted] weeks gestation 1 stage of labor FHR category 1   Plan:   Admit for active labor Labs pending Epidural when desired Continuous fetal monitoring   1. Fetal Well being  - Fetal Tracing: Cat I - Ultrasound:  reviewed, as above - Group B Streptococcus: neg - Presentation: vtx confirmed by sutures   2. Routine OB: - Prenatal labs reviewed, as above - Rh O positive

## 2017-07-31 NOTE — OB Triage Note (Signed)
Patient to OBS2 with complaints of contractions and pink noted on toilet tissue after going to the bathroom.  She rates her pain as 8/10 intermittent.

## 2017-08-01 LAB — CBC
HEMATOCRIT: 35.4 % (ref 35.0–47.0)
HEMOGLOBIN: 11.6 g/dL — AB (ref 12.0–16.0)
MCH: 23.4 pg — AB (ref 26.0–34.0)
MCHC: 32.8 g/dL (ref 32.0–36.0)
MCV: 71.4 fL — AB (ref 80.0–100.0)
Platelets: 184 10*3/uL (ref 150–440)
RBC: 4.96 MIL/uL (ref 3.80–5.20)
RDW: 15.6 % — AB (ref 11.5–14.5)
WBC: 11.4 10*3/uL — AB (ref 3.6–11.0)

## 2017-08-01 LAB — RPR: RPR: NONREACTIVE

## 2017-08-01 MED ORDER — MEDROXYPROGESTERONE ACETATE 150 MG/ML IM SUSP
150.0000 mg | Freq: Once | INTRAMUSCULAR | Status: AC
  Administered 2017-08-01: 150 mg via INTRAMUSCULAR
  Filled 2017-08-01: qty 1

## 2017-08-01 MED ORDER — IBUPROFEN 600 MG PO TABS: 600.0000 mg | ORAL_TABLET | Freq: Four times a day (QID) | ORAL | 0 refills | Status: DC

## 2017-08-01 NOTE — Progress Notes (Signed)
Post Partum Day 1 Subjective: no complaints  Objective: Blood pressure 124/79, pulse 72, temperature 98.8 F (37.1 C), temperature source Oral, resp. rate 18, height 5\' 3"  (1.6 m), weight 108.3 kg (238 lb 12.8 oz), last menstrual period 10/21/2016, SpO2 100 %, unknown if currently breastfeeding.  Physical Exam:  General: alert and cooperative Lochia: appropriate Uterine Fundus: firm Incision: n/a DVT Evaluation: No evidence of DVT seen on physical exam.   Recent Labs  07/31/17 1451 08/01/17 0519  HGB 11.9* 11.6*  HCT 36.7 35.4    Assessment/Plan: Nl PP exam  Pt may desire d/c to home today    LOS: 1 day   Laura Watkins 08/01/2017, 12:13 PM

## 2017-08-01 NOTE — Anesthesia Postprocedure Evaluation (Signed)
Anesthesia Post Note  Patient: Laura SiresKwamecia Watkins  Procedure(s) Performed: * No procedures listed *  Patient location during evaluation: Mother Baby Anesthesia Type: Epidural Level of consciousness: awake and alert Pain management: pain level controlled Vital Signs Assessment: post-procedure vital signs reviewed and stable Respiratory status: spontaneous breathing, nonlabored ventilation and respiratory function stable Cardiovascular status: stable Postop Assessment: no headache and no backache (epidural resolved) Anesthetic complications: no     Last Vitals:  Vitals:   08/01/17 0819 08/01/17 1217  BP: 124/79 128/77  Pulse: 72 69  Resp: 18 (!) 22  Temp: 37.1 C 36.7 C  SpO2: 100% 99%    Last Pain:  Vitals:   08/01/17 1500  TempSrc:   PainSc: 7                  Lenard SimmerAndrew Abiageal Blowe

## 2017-08-02 NOTE — Progress Notes (Addendum)
08/02/2017 12:32 AM  BP 125/77 (BP Location: Right Arm)   Pulse 83   Temp 98.4 F (36.9 C) (Oral)   Resp 16   Ht 5\' 3"  (160 cm)   Wt 238 lb 12.8 oz (108319 g)   LMP 10/21/2016   SpO2 100%   Breastfeeding? Unknown   BMI 42.30 kg/m  Patient discharged per MD orders. Discharge instructions reviewed with patient and patient verbalized understanding. Prescriptions discussed and given to patient. Dischargedambulatory escorted by significant other with infant and belongings in hand. Patient refused wheelchair and nursing staff to accompany her walking to car.  Ron ParkerHerron, Janessa Mickle D, RN

## 2017-11-02 ENCOUNTER — Other Ambulatory Visit: Payer: Self-pay | Admitting: *Deleted

## 2017-11-02 DIAGNOSIS — D649 Anemia, unspecified: Secondary | ICD-10-CM

## 2017-11-02 NOTE — Progress Notes (Deleted)
[facility]  Telephone:(336) [phone] [phone]  ID: Laura SiresKwamecia Umanzor OB: Feb 19, 1991  MR#: [id number]  QIO#:[id number]#:[id number]  Patient Care Team: [facility] - General (General Practice)  CHIEF COMPLAINT: Anemia affecting pregnancy in the third trimester.  INTERVAL HISTORY: Patient returns to clinic today for repeat laboratory work, further evaluation, and consideration of additional Feraheme. She does not complain of weakness and fatigue today. She currently feels well and is asymptomatic. She has no neurologic complaints. She is gaining weight appropriately. She denies any recent fevers or illnesses. She has no chest pain or shortness of breath. She denies any nausea, vomiting, constipation, or diarrhea. She has no melena or hematochezia. She has no urinary complaints. Patient offers no specific complaints today.  REVIEW OF SYSTEMS:   Review of Systems  Constitutional: Negative for fever, malaise/fatigue and weight loss.  Respiratory: Negative.  Negative for cough and shortness of breath.   Cardiovascular: Negative.  Negative for chest pain and leg swelling.  Gastrointestinal: Negative.  Negative for abdominal pain, blood in stool and melena.  Genitourinary: Negative.   Musculoskeletal: Negative.   Skin: Negative.  Negative for rash.  Neurological: Negative for weakness.  Psychiatric/Behavioral: Negative.  The patient is not nervous/anxious.     As per HPI. Otherwise, a complete review of systems is negative.  PAST MEDICAL HISTORY: Past Medical History:  Diagnosis Date  . Anemia     PAST SURGICAL HISTORY: Past Surgical History:  Procedure Laterality Date  . APPENDECTOMY    . COLON SURGERY      FAMILY HISTORY: Reviewed and unchanged.  ADVANCED DIRECTIVES (Y/N):  N  HEALTH MAINTENANCE: Social History   Tobacco Use  . Smoking status: Former Games developermoker  . Smokeless tobacco: Never Used  Substance Use Topics  . Alcohol  use: No    Comment: occasional  . Drug use: No     Colonoscopy:  PAP:  Bone density:  Lipid panel:  No Known Allergies  Current Outpatient Medications  Medication Sig Dispense Refill  . ferrous sulfate 325 (65 FE) MG tablet Take 325 mg by mouth daily with breakfast.    . ibuprofen (ADVIL,MOTRIN) 600 MG tablet Take 1 tablet (600 mg total) by mouth every 6 (six) hours. 30 tablet 0  . Prenatal Vit-Fe Fumarate-FA (PRENATAL MULTIVITAMIN) TABS tablet Take 1 tablet by mouth daily at 12 noon.     No current facility-administered medications for this visit.     OBJECTIVE: There were no vitals filed for this visit.   There is no height or weight on file to calculate BMI.    ECOG FS:0 - Asymptomatic  General: Well-developed, well-nourished, no acute distress. Eyes: Pink conjunctiva, anicteric sclera. Lungs: Clear to auscultation bilaterally. Heart: Regular rate and rhythm. No rubs, murmurs, or gallops. Abdomen: Appears appropriate for gestational age. Musculoskeletal: No edema, cyanosis, or clubbing. Neuro: Alert, answering all questions appropriately. Cranial nerves grossly intact. Skin: No rashes or petechiae noted. Psych: Normal affect.  LAB RESULTS:  Lab Results  Component Value Date   NA 136 06/03/2017   K 3.6 06/03/2017   CL 105 06/03/2017   CO2 24 06/03/2017   GLUCOSE 74 06/03/2017   BUN <5 (L) 06/03/2017   CREATININE 0.36 (L) 06/03/2017   CALCIUM 9.0 06/03/2017   PROT 6.7 03/03/2017   ALBUMIN 3.2 (L) 03/03/2017   AST 28 03/03/2017   ALT 18 03/03/2017   ALKPHOS 55 03/03/2017   BILITOT 0.6 03/03/2017   GFRNONAA >60 06/03/2017   GFRAA >  60 06/03/2017    Lab Results  Component Value Date   WBC 11.4 (H) 08/01/2017   NEUTROABS 5.9 07/01/2017   HGB 11.6 (L) 08/01/2017   HCT 35.4 08/01/2017   MCV 71.4 (L) 08/01/2017   PLT 184 08/01/2017   Lab Results  Component Value Date   IRON 81 07/01/2017   TIBC 475 (H) 07/01/2017   IRONPCTSAT 17 07/01/2017   Lab Results   Component Value Date   FERRITIN 35 07/01/2017     STUDIES: No results found.  ASSESSMENT: Anemia affecting pregnancy in the third trimester  PLAN:    1. Anemia affecting pregnancy in the third trimester: Patient's hemoglobin Has significantly improved and is now greater than 10.0. Her iron stores are within normal limits. She reports she cannot tolerate oral iron supplementation. She does not require additional IV iron today. Return to clinic in 4 months, which will be approximately 3 months postpartum for repeat laboratory work and further evaluation.  2. Pregnancy: Patient reports her due date is July 28, 2017. Continue monitoring per OB/GYN.  Patient expressed understanding and was in agreement with this plan. She also understands that She can call clinic at any time with any questions, concerns, or complaints.    [name] Yocheved Depner, MD   11/02/2017 10:40 PM

## 2017-11-03 ENCOUNTER — Inpatient Hospital Stay: Payer: Self-pay | Admitting: Oncology

## 2017-11-03 ENCOUNTER — Inpatient Hospital Stay: Payer: Self-pay

## 2019-01-25 ENCOUNTER — Other Ambulatory Visit: Payer: Self-pay

## 2019-01-25 ENCOUNTER — Encounter: Payer: Self-pay | Admitting: Emergency Medicine

## 2019-01-25 ENCOUNTER — Emergency Department
Admission: EM | Admit: 2019-01-25 | Discharge: 2019-01-25 | Disposition: A | Discharge status: Home / Self Care | Payer: No Typology Code available for payment source | Attending: Emergency Medicine | Admitting: Emergency Medicine

## 2019-01-25 ENCOUNTER — Emergency Department: Payer: No Typology Code available for payment source

## 2019-01-25 DIAGNOSIS — M25551 Pain in right hip: Secondary | ICD-10-CM | POA: Insufficient documentation

## 2019-01-25 DIAGNOSIS — S161XXA Strain of muscle, fascia and tendon at neck level, initial encounter: Secondary | ICD-10-CM | POA: Insufficient documentation

## 2019-01-25 DIAGNOSIS — Y9389 Activity, other specified: Secondary | ICD-10-CM | POA: Insufficient documentation

## 2019-01-25 DIAGNOSIS — Z87891 Personal history of nicotine dependence: Secondary | ICD-10-CM | POA: Insufficient documentation

## 2019-01-25 DIAGNOSIS — S39012A Strain of muscle, fascia and tendon of lower back, initial encounter: Secondary | ICD-10-CM | POA: Insufficient documentation

## 2019-01-25 DIAGNOSIS — Y999 Unspecified external cause status: Secondary | ICD-10-CM | POA: Insufficient documentation

## 2019-01-25 DIAGNOSIS — Y92410 Unspecified street and highway as the place of occurrence of the external cause: Secondary | ICD-10-CM | POA: Insufficient documentation

## 2019-01-25 DIAGNOSIS — Z79899 Other long term (current) drug therapy: Secondary | ICD-10-CM | POA: Insufficient documentation

## 2019-01-25 LAB — POCT PREGNANCY, URINE: Preg Test, Ur: NEGATIVE

## 2019-01-25 MED ORDER — CYCLOBENZAPRINE HCL 10 MG PO TABS: 10.0000 mg | ORAL_TABLET | Freq: Three times a day (TID) | ORAL | 0 refills | Status: DC | PRN

## 2019-01-25 MED ORDER — NAPROXEN 500 MG PO TABS: 500.0000 mg | ORAL_TABLET | Freq: Two times a day (BID) | ORAL | 0 refills | Status: DC

## 2019-01-25 MED ORDER — NAPROXEN 500 MG PO TABS
500.0000 mg | ORAL_TABLET | Freq: Once | ORAL | Status: AC
  Administered 2019-01-25: 500 mg via ORAL
  Filled 2019-01-25: qty 1
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
## 2019-01-25 NOTE — Discharge Instructions (Signed)
Please follow-up with your primary care provider for symptoms that are not improving over the week.  In addition to the prescribed medications, you may take Tylenol every 4 hours.  Return to the emergency department for symptoms of change or worsen if you are unable to schedule appointment.
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
## 2019-03-01 ENCOUNTER — Other Ambulatory Visit: Payer: Self-pay

## 2019-03-01 ENCOUNTER — Emergency Department
Admission: EM | Admit: 2019-03-01 | Discharge: 2019-03-02 | Disposition: A | Discharge status: Home / Self Care | Payer: Self-pay | Attending: Emergency Medicine | Admitting: Emergency Medicine

## 2019-03-01 ENCOUNTER — Encounter: Payer: Self-pay | Admitting: Emergency Medicine

## 2019-03-01 DIAGNOSIS — Z79899 Other long term (current) drug therapy: Secondary | ICD-10-CM | POA: Insufficient documentation

## 2019-03-01 DIAGNOSIS — T7840XA Allergy, unspecified, initial encounter: Secondary | ICD-10-CM

## 2019-03-01 DIAGNOSIS — Z87891 Personal history of nicotine dependence: Secondary | ICD-10-CM | POA: Insufficient documentation

## 2019-03-01 MED ORDER — SODIUM CHLORIDE 0.9 % IV BOLUS
1000.0000 mL | Freq: Once | INTRAVENOUS | Status: AC
  Administered 2019-03-01: 22:00:00 1000 mL via INTRAVENOUS

## 2019-03-01 MED ORDER — METHYLPREDNISOLONE SODIUM SUCC 125 MG IJ SOLR
INTRAMUSCULAR | Status: AC
  Filled 2019-03-01: qty 2

## 2019-03-01 MED ORDER — FAMOTIDINE IN NACL 20-0.9 MG/50ML-% IV SOLN
20.0000 mg | Freq: Once | INTRAVENOUS | Status: AC
  Administered 2019-03-01: 22:00:00 20 mg via INTRAVENOUS

## 2019-03-01 MED ORDER — METHYLPREDNISOLONE SODIUM SUCC 125 MG IJ SOLR
125.0000 mg | Freq: Once | INTRAMUSCULAR | Status: AC
  Administered 2019-03-01: 125 mg via INTRAVENOUS

## 2019-03-01 MED ORDER — DIPHENHYDRAMINE HCL 50 MG/ML IJ SOLN
25.0000 mg | Freq: Once | INTRAMUSCULAR | Status: AC
  Administered 2019-03-01: 22:00:00 25 mg via INTRAVENOUS

## 2019-03-01 MED ORDER — FAMOTIDINE IN NACL 20-0.9 MG/50ML-% IV SOLN
20.0000 mg | Freq: Once | INTRAVENOUS | Status: DC
Start: 2019-03-01 — End: 2019-03-01

## 2019-03-01 MED ORDER — DIPHENHYDRAMINE HCL 50 MG/ML IJ SOLN
INTRAMUSCULAR | Status: AC
  Filled 2019-03-01: qty 1

## 2019-03-01 MED ORDER — PREDNISONE 20 MG PO TABS: 40.0000 mg | ORAL_TABLET | Freq: Every day | ORAL | 0 refills | Status: DC

## 2019-03-01 MED ORDER — DIPHENHYDRAMINE HCL 50 MG/ML IJ SOLN: 25.0000 mg | Freq: Once | INTRAMUSCULAR | Status: DC

## 2019-03-01 MED ORDER — ONDANSETRON HCL 4 MG/2ML IJ SOLN
INTRAMUSCULAR | Status: AC
  Administered 2019-03-01: 22:00:00 4 mg
  Filled 2019-03-01: qty 2

## 2019-03-01 MED ORDER — METHYLPREDNISOLONE SODIUM SUCC 125 MG IJ SOLR: 125.0000 mg | Freq: Once | INTRAMUSCULAR | Status: DC

## 2019-03-01 NOTE — ED Provider Notes (Signed)
[facility] Emergency Department Provider Note  Time seen: 9:46 PM  I have reviewed the triage vital signs and the nursing notes.   HISTORY  Chief Complaint Allergic Reaction   HPI Laura Watkins is a 28 y.o. female with no significant past medical history presents to the emergency department for shortness of breath and hives.  According to the patient this evening around 530 she took naproxen, states approximately 30 minutes later began with itching and tingling in her hands and feet, followed by shortness of breath and developed hives of her body.  Patient took 25 mg of oral Benadryl but states she vomited x1 shortly after that.  Took 50 mg oral Benadryl but again vomited shortly after that as well.  Patient states she is only had one allergic reaction previously and that was to Advil per patient.  Patient denies any shortness of breath currently.  Continues to state itching all over.   Past Medical History:  Diagnosis Date  . Anemia     Patient Active Problem List   Diagnosis Date Noted  . Pregnancy 07/31/2017  . Preterm uterine contractions in third trimester, antepartum 06/03/2017  . Anemia affecting pregnancy in third trimester 05/05/2017  . Normal spontaneous vaginal delivery 06/20/2015    Past Surgical History:  Procedure Laterality Date  . APPENDECTOMY    . COLON SURGERY      Prior to Admission medications   Medication Sig Start Date End Date Taking? Authorizing Provider  cyclobenzaprine (FLEXERIL) 10 MG tablet Take 1 tablet (10 mg total) by mouth 3 (three) times daily as needed for muscle spasms. 01/25/19   Triplett, Rulon Eisenmenger B, FNP  ferrous sulfate 325 (65 FE) MG tablet Take 325 mg by mouth daily with breakfast.    [provider]  naproxen (NAPROSYN) 500 MG tablet Take 1 tablet (500 mg total) by mouth 2 (two) times daily with a meal. 01/25/19   Triplett, Cari B, FNP    No Known Allergies  History reviewed. No pertinent family  history.  Social History Social History   Tobacco Use  . Smoking status: Former Games developer  . Smokeless tobacco: Never Used  Substance Use Topics  . Alcohol use: No    Comment: occasional  . Drug use: No    Review of Systems Constitutional: Negative for fever. Cardiovascular: Negative for chest pain. Respiratory: Mild shortness of breath, improving. Gastrointestinal: Negative for abdominal pain, vomiting  Genitourinary: Negative for urinary compaints Musculoskeletal: Negative for musculoskeletal complaints Skin: Negative for skin complaints  Neurological: Negative for headache All other ROS negative  ____________________________________________   PHYSICAL EXAM:  Constitutional: Alert and oriented. Well appearing and in no distress. Eyes: Conjunctival injection bilaterally. ENT   Head: Normocephalic and atraumatic   Mouth/Throat: Mucous membranes are moist. Cardiovascular: Normal rate, regular rhythm. No murmur Respiratory: Normal respiratory effort without tachypnea nor retractions. Breath sounds are clear, without wheeze. Gastrointestinal: Soft and nontender. No distention.   Musculoskeletal: Nontender with normal range of motion in all extremities. Neurologic:  Normal speech and language. No gross focal neurologic deficits  Skin: Flushing of the skin with several hives on her upper extremities. Psychiatric: Mood and affect are normal.  ____________________________________________   INITIAL IMPRESSION / ASSESSMENT AND PLAN / ED COURSE  Pertinent labs & imaging results that were available during my care of the patient were reviewed by me and considered in my medical decision making (see chart for details).  Patient presents to the emergency department for shortness of breath hives itching  after taking naproxen tonight at 5:30 PM.  I had a long discussion with the patient that she should avoid all NSAIDs in the future if she has not had allergic reaction to  naproxen as well as Advil.  I discussed with the patient to avoid product such as Advil, Aleve, Motrin, ibuprofen, aspirin.  Patient agreeable to plan of care.  Patient does have mild erythema flushing of her skin, small amount of hives on the upper extremities.  Clear lung sounds without wheeze.  No oral pharyngeal swelling.  We will dose IV Benadryl, Solu-Medrol, Pepcid as well as IV fluids and continue to closely monitor. Differential this time would include allergic reaction versus angioedema.  Patient appears much improved.  Will monitor until approximately midnight. The patient continues to appear well with plan on discharging home.  Patient agreeable to plan of care.  Patient care signed out to Dr. Manson PasseyBrown.  ____________________________________________   FINAL CLINICAL IMPRESSION(S) / ED DIAGNOSES  Allergic reaction   Minna AntisPaduchowski, Tyjanae Bartek, MD 03/01/19 2328
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
## 2019-03-01 NOTE — Discharge Instructions (Signed)
Please take your prednisone as prescribed.  You may also use Benadryl 50 mg every 6 hours as needed for itching or hives.  Return immediately to the emergency department for any trouble breathing or swallowing.  As we discussed please avoid all NSAIDs in the future including Advil, Aleve, ibuprofen, Motrin and aspirin.  You may use Tylenol or acetaminophen if needed for fever or discomfort in the future.

## 2019-03-02 NOTE — ED Notes (Signed)
ED Provider, Manson Passey at bedside.

## 2019-10-31 ENCOUNTER — Other Ambulatory Visit: Payer: Self-pay | Admitting: Primary Care

## 2019-10-31 DIAGNOSIS — Z3480 Encounter for supervision of other normal pregnancy, unspecified trimester: Secondary | ICD-10-CM

## 2019-11-07 ENCOUNTER — Other Ambulatory Visit: Payer: Self-pay

## 2019-11-07 ENCOUNTER — Ambulatory Visit
Admission: RE | Admit: 2019-11-07 | Discharge: 2019-11-07 | Disposition: A | Discharge status: Home / Self Care | Payer: Self-pay | Source: Ambulatory Visit | Attending: Primary Care | Admitting: Primary Care

## 2019-11-07 DIAGNOSIS — Z3480 Encounter for supervision of other normal pregnancy, unspecified trimester: Secondary | ICD-10-CM | POA: Insufficient documentation

## 2020-01-24 ENCOUNTER — Observation Stay
Admission: EM | Admit: 2020-01-24 | Discharge: 2020-01-24 | Disposition: A | Discharge status: Home / Self Care | Payer: Medicaid Other

## 2020-01-24 ENCOUNTER — Other Ambulatory Visit: Payer: Self-pay

## 2020-01-24 DIAGNOSIS — Z3A31 31 weeks gestation of pregnancy: Secondary | ICD-10-CM | POA: Diagnosis not present

## 2020-01-24 DIAGNOSIS — Z87891 Personal history of nicotine dependence: Secondary | ICD-10-CM | POA: Diagnosis not present

## 2020-01-24 DIAGNOSIS — O26899 Other specified pregnancy related conditions, unspecified trimester: Secondary | ICD-10-CM | POA: Diagnosis present

## 2020-01-24 DIAGNOSIS — B9689 Other specified bacterial agents as the cause of diseases classified elsewhere: Secondary | ICD-10-CM | POA: Diagnosis not present

## 2020-01-24 DIAGNOSIS — O23593 Infection of other part of genital tract in pregnancy, third trimester: Secondary | ICD-10-CM | POA: Diagnosis present

## 2020-01-24 DIAGNOSIS — R109 Unspecified abdominal pain: Secondary | ICD-10-CM | POA: Diagnosis present

## 2020-01-24 LAB — WET PREP, GENITAL
Sperm: NONE SEEN
Trich, Wet Prep: NONE SEEN
Yeast Wet Prep HPF POC: NONE SEEN

## 2020-01-24 MED ORDER — ACETAMINOPHEN 325 MG PO TABS
650.0000 mg | ORAL_TABLET | ORAL | Status: DC | PRN
  Administered 2020-01-24: 650 mg via ORAL
  Filled 2020-01-24: qty 2

## 2020-01-24 MED ORDER — CALCIUM CARBONATE ANTACID 500 MG PO CHEW
2.0000 | CHEWABLE_TABLET | ORAL | Status: DC | PRN
  Administered 2020-01-24: 400 mg via ORAL
  Filled 2020-01-24: qty 2

## 2020-01-24 MED ORDER — METRONIDAZOLE 500 MG PO TABS: 500.0000 mg | ORAL_TABLET | Freq: Two times a day (BID) | ORAL | 0 refills | Status: AC

## 2020-01-24 NOTE — Discharge Summary (Signed)
Laura Watkins is a 29 y.o. female. She is at [redacted]w[redacted]d gestation. Patient's last menstrual period was 06/16/2019. Estimated Date of Delivery: 03/22/20  Prenatal care site:  [facility]   Chief complaint: upper abdominal pain   States that she she had constant, sharp upper abdominal pain last night.  Reports that pain has gotten better and is now more intermittent.  Denies pain that radiates to shoulder.  Denies contractions, cramping, and LOF.  Reports vaginal mucus when wiping.  Endorses good fetal movement.    Maternal Medical History:  Past Medical Hx:  has a past medical history of Anemia.    Past Surgical Hx:  has a past surgical history that includes Appendectomy and Colon surgery.   Allergies  Allergen Reactions  . Naproxen Sodium Hives, Itching and Swelling    Swelling of hands feet and face with difficulty in swallowing.  . Nsaids      Prior to Admission medications   Medication Sig Start Date End Date Taking? Authorizing Provider  cyclobenzaprine (FLEXERIL) 10 MG tablet Take 1 tablet (10 mg total) by mouth 3 (three) times daily as needed for muscle spasms. Patient not taking: Reported on 01/24/2020 01/25/19   Sherrie George B, FNP  naproxen (NAPROSYN) 500 MG tablet Take 1 tablet (500 mg total) by mouth 2 (two) times daily with a meal. Patient not taking: Reported on 01/24/2020 01/25/19   Sherrie George B, FNP  predniSONE (DELTASONE) 20 MG tablet Take 2 tablets (40 mg total) by mouth daily. Patient not taking: Reported on 01/24/2020 03/01/19   Harvest Dark, MD    Social History: She  reports that she has quit smoking. She has never used smokeless tobacco. She reports that she does not drink alcohol or use drugs.  Family History: Family history reviewed, no pertinent history, no history of gyn cancers  Review of Systems: A full review of systems was performed and negative except as noted in the HPI.    O:  BP 114/70 (BP Location: Right Arm)   Pulse 93   Temp 98.6 F (37 C)  (Oral)   Resp 17   Ht 5\' 3"  (1.6 m)   Wt 105.7 kg   LMP 06/16/2019   BMI 41.27 kg/m  Results for orders placed or performed during the hospital encounter of 01/24/20 (from the past 48 hour(s))  Wet prep, genital   Collection Time: 01/24/20  8:22 AM   Specimen: Vaginal  Result Value Ref Range   Yeast Wet Prep HPF POC NONE SEEN NONE SEEN   Trich, Wet Prep NONE SEEN NONE SEEN   Clue Cells Wet Prep HPF POC PRESENT (A) NONE SEEN   WBC, Wet Prep HPF POC MODERATE (A) NONE SEEN   Sperm NONE SEEN      Constitutional: NAD, AAOx3  HE/ENT: extraocular movements grossly intact, moist mucous membranes CV: RRR PULM: nl respiratory effort, CTABL     Abd: gravid, non-tender, non-distended, soft      Ext: Non-tender, Nonedmeatous   Psych: mood appropriate, speech normal Pelvic: moderate amount of thin white to gray vaginal discharge, no vaginal bleeding.  Cervix: visually long and closed with speculum exam.   Fetal monitoring: Baseline: 130 bpm Variability: moderate Accelerations present x >2 - appropriate for gestational age  Decelerations absent Time 54mins   Assessment: 29 y.o. [redacted]w[redacted]d here for antenatal surveillance during pregnancy.  Principle diagnosis: abdominal pain in pregnancy, bacterial vaginosis   Plan:  Labor: not present.   Fetal Wellbeing: Reassuring Cat 1 tracing, appropriate for  gestational age   Flagyl 500mg  BID x 7 days for bacterial vaginosis  May use tums, simethicone, and pepcid for upper abdominal discomfort.   D/c home stable, precautions reviewed, follow-up as scheduled.   ----- June, CNM Certified Nurse Midwife [facility]

## 2020-01-24 NOTE — OB Triage Note (Signed)
RN reviewed discharge instructions with pt. Pt instructed to pick up medication at pharmacy and follow up with care provider. Pt verbalized understanding of D/c Instructions and has no further questions at this time. Pt discharged home in stable condition.

## 2020-01-24 NOTE — Discharge Instructions (Signed)

## 2020-01-24 NOTE — Progress Notes (Signed)
Lucile Crater CNM notified of pt arrival to unit. RN awaiting orders.

## 2020-01-24 NOTE — Progress Notes (Signed)
CNM notified of pt lab results. Orders given to D/C fetal monitoring and RN awaiting D/C orders.

## 2020-01-24 NOTE — OB Triage Note (Signed)
Pt is a G5P4 at [redacted]w[redacted]d that presents from home with c/o upper abdominal pain. Pt states the pain "is a stabbing pain that was constant but now it comes and goes." Pt denies VB or LOF but states she has had some "Mucous when wiping." Monitors applied and initial fht 150. Vitals WDL.

## 2020-05-09 ENCOUNTER — Emergency Department
Admission: EM | Admit: 2020-05-09 | Discharge: 2020-05-09 | Disposition: A | Discharge status: Home / Self Care | Payer: Medicaid Other | Attending: Emergency Medicine | Admitting: Emergency Medicine

## 2020-05-09 ENCOUNTER — Other Ambulatory Visit: Payer: Self-pay

## 2020-05-09 ENCOUNTER — Encounter: Payer: Self-pay | Admitting: Emergency Medicine

## 2020-05-09 DIAGNOSIS — Z87891 Personal history of nicotine dependence: Secondary | ICD-10-CM | POA: Insufficient documentation

## 2020-05-09 DIAGNOSIS — R52 Pain, unspecified: Secondary | ICD-10-CM | POA: Diagnosis present

## 2020-05-09 DIAGNOSIS — L03317 Cellulitis of buttock: Secondary | ICD-10-CM

## 2020-05-09 MED ORDER — HYDROCODONE-ACETAMINOPHEN 5-325 MG PO TABS: 1.0000 | ORAL_TABLET | Freq: Three times a day (TID) | ORAL | 0 refills | Status: AC | PRN

## 2020-05-09 MED ORDER — SULFAMETHOXAZOLE-TRIMETHOPRIM 800-160 MG PO TABS
1.0000 | ORAL_TABLET | Freq: Once | ORAL | Status: AC
  Administered 2020-05-09: 1 via ORAL
  Filled 2020-05-09: qty 1

## 2020-05-09 MED ORDER — SULFAMETHOXAZOLE-TRIMETHOPRIM 800-160 MG PO TABS: 1.0000 | ORAL_TABLET | Freq: Two times a day (BID) | ORAL | 0 refills | Status: DC

## 2020-05-09 MED ORDER — HYDROCODONE-ACETAMINOPHEN 5-325 MG PO TABS
1.0000 | ORAL_TABLET | Freq: Once | ORAL | Status: AC
  Administered 2020-05-09: 1 via ORAL
  Filled 2020-05-09: qty 1

## 2020-05-09 NOTE — Discharge Instructions (Signed)
Your exam is consistent with a cellulitis and mild abscess. Take the antibiotic as directed, and the pain medicine as needed. Apply warm compresses to promote healing. Follow-up with your provider as needed.

## 2020-05-09 NOTE — ED Provider Notes (Signed)
[facility] Emergency Department Provider Note ____________________________________________  Time seen: 1353  I have reviewed the triage vital signs and the nursing notes.  HISTORY  Chief Complaint  Abscess  HPI Laura Watkins is a 29 y.o. female presents to the ED with concern over possible abscess.  Patient notes an area to the buttocks has been tender for the last 2 months.  She denies any spontaneous drainage, purulence, redness, pointing, or fluctuance.   Past Medical History:  Diagnosis Date  . Anemia     Patient Active Problem List   Diagnosis Date Noted  . Abdominal pain affecting pregnancy 01/24/2020  . Pregnancy 07/31/2017  . Preterm uterine contractions in third trimester, antepartum 06/03/2017  . Anemia affecting pregnancy in third trimester 05/05/2017  . Normal spontaneous vaginal delivery 06/20/2015    Past Surgical History:  Procedure Laterality Date  . APPENDECTOMY    . COLON SURGERY      Prior to Admission medications   Medication Sig Start Date End Date Taking? Authorizing Provider  HYDROcodone-acetaminophen (NORCO) 5-325 MG tablet Take 1 tablet by mouth 3 (three) times daily as needed for up to 3 days. 05/09/20 05/12/20  Toneshia Coello, Dannielle Karvonen, PA-C  sulfamethoxazole-trimethoprim (BACTRIM DS) 800-160 MG tablet Take 1 tablet by mouth 2 (two) times daily. 05/09/20   Froylan Hobby, Dannielle Karvonen, PA-C    Allergies Naproxen sodium and Nsaids  History reviewed. No pertinent family history.  Social History Social History   Tobacco Use  . Smoking status: Former Research scientist (life sciences)  . Smokeless tobacco: Never Used  Vaping Use  . Vaping Use: Never assessed  Substance Use Topics  . Alcohol use: No    Comment: occasional  . Drug use: No    Review of Systems  Constitutional: Negative for fever. Respiratory: Negative for shortness of breath. Gastrointestinal: Negative for abdominal pain, vomiting and diarrhea. Genitourinary: Negative for  dysuria. Musculoskeletal: Negative for back pain. Skin: Negative for rash. Buttock tenderness as above Neurological: Negative for headaches, focal weakness or numbness. ____________________________________________  PHYSICAL EXAM:  VITAL SIGNS: ED Triage Vitals  Enc Vitals Group     BP 05/09/20 1326 135/78     Pulse Rate 05/09/20 1326 88     Resp 05/09/20 1326 20     Temp 05/09/20 1326 98 F (36.7 C)     Temp Source 05/09/20 1326 Oral     SpO2 05/09/20 1326 99 %     Weight 05/09/20 1321 232 lb (105.2 kg)     Height 05/09/20 1321 5\' 3"  (1.6 m)     Head Circumference --      Peak Flow --      Pain Score 05/09/20 1320 7     Pain Loc --      Pain Edu? --      Excl. in [place]? --     Constitutional: Alert and oriented. Well appearing and in no distress. Head: Normocephalic and atraumatic. Eyes: Conjunctivae are normal. Normal extraocular movements Cardiovascular: Normal rate, regular rhythm. Normal distal pulses. Respiratory: Normal respiratory effort. No wheezes/rales/rhonchi. Gastrointestinal: Soft and nontender. No distention. Musculoskeletal: Nontender with normal range of motion in all extremities.  Neurologic:  Normal gait without ataxia. Normal speech and language. No gross focal neurologic deficits are appreciated. Skin:  Skin is warm, dry and intact. No rash noted.  Right buttocks with an area of firmness and induration deep to the dermal layer.  There is a single papule but no appreciable fluctuance, pointing, or fluctuance.  Patient with  multiple other areas that appear to be healed, consistent with previous spontaneously resolving abscess. ____________________________________________  PROCEDURES  Bactrim DS 1 tab p.o. Norco 500-3 25-325 mg p.o.  Procedures ____________________________________________  INITIAL IMPRESSION / ASSESSMENT AND PLAN / ED COURSE  Patient with ED evaluation of a tender firm indurated area to the right buttocks consistent with a deep  cellulitis.  The area is not appropriate for I&D at this presentation.  Patient will be started on Bactrim and hydrocodone for pain.  She will continue to monitor the area closely return to the ED for any pointing fluctuant abscess that is amenable to I&D procedure.  Laura Watkins was evaluated in Emergency Department on 05/09/2020 for the symptoms described in the history of present illness. She was evaluated in the context of the global COVID-19 pandemic, which necessitated consideration that the patient might be at risk for infection with the SARS-CoV-2 virus that causes COVID-19. Institutional protocols and algorithms that pertain to the evaluation of patients at risk for COVID-19 are in a state of rapid change based on information released by regulatory bodies including the CDC and federal and state organizations. These policies and algorithms were followed during the patient's care in the ED.  I reviewed the patient's prescription history over the last 12 months in the multi-state controlled substances database(s) that includes [place], [place], [place], [place], [place], [place], [place], [place], [place], [place], [place], [place], [place], and [place].  Results were notable for no RX history. ____________________________________________  FINAL CLINICAL IMPRESSION(S) / ED DIAGNOSES  Final diagnoses:  Cellulitis of buttock      Karmen Stabs, Charlesetta Ivory, PA-C 05/09/20 1436    Dionne Bucy, MD 05/09/20 1552

## 2020-05-09 NOTE — ED Triage Notes (Signed)
Presents with possible abscess to buttock area  Stats she noticed some soreness to area about 2 month ago

## 2020-06-13 ENCOUNTER — Other Ambulatory Visit: Payer: Self-pay | Admitting: Primary Care

## 2020-06-13 ENCOUNTER — Ambulatory Visit
Admission: RE | Admit: 2020-06-13 | Discharge: 2020-06-13 | Disposition: A | Discharge status: Home / Self Care | Payer: Medicaid Other | Attending: Primary Care | Admitting: Primary Care

## 2020-06-13 ENCOUNTER — Encounter: Payer: Self-pay | Admitting: Primary Care

## 2020-06-13 ENCOUNTER — Ambulatory Visit
Admission: RE | Admit: 2020-06-13 | Discharge: 2020-06-13 | Disposition: A | Discharge status: Home / Self Care | Payer: Medicaid Other | Source: Ambulatory Visit | Attending: Primary Care | Admitting: Primary Care

## 2020-06-13 DIAGNOSIS — M25561 Pain in right knee: Secondary | ICD-10-CM | POA: Diagnosis present

## 2020-06-19 ENCOUNTER — Encounter: Payer: Self-pay | Admitting: Emergency Medicine

## 2020-06-19 ENCOUNTER — Other Ambulatory Visit: Payer: Self-pay

## 2020-06-19 ENCOUNTER — Emergency Department
Admission: EM | Admit: 2020-06-19 | Discharge: 2020-06-19 | Disposition: A | Discharge status: Home / Self Care | Payer: Medicaid Other | Attending: Emergency Medicine | Admitting: Emergency Medicine

## 2020-06-19 DIAGNOSIS — M25511 Pain in right shoulder: Secondary | ICD-10-CM | POA: Diagnosis not present

## 2020-06-19 DIAGNOSIS — R1110 Vomiting, unspecified: Secondary | ICD-10-CM | POA: Diagnosis not present

## 2020-06-19 DIAGNOSIS — R519 Headache, unspecified: Secondary | ICD-10-CM | POA: Diagnosis not present

## 2020-06-19 DIAGNOSIS — Z5321 Procedure and treatment not carried out due to patient leaving prior to being seen by health care provider: Secondary | ICD-10-CM | POA: Diagnosis not present

## 2020-06-19 HISTORY — DX: Essential (primary) hypertension: I10

## 2020-06-19 LAB — BASIC METABOLIC PANEL
Anion gap: 7 (ref 5–15)
BUN: 13 mg/dL (ref 6–20)
CO2: 30 mmol/L (ref 22–32)
Calcium: 9 mg/dL (ref 8.9–10.3)
Chloride: 104 mmol/L (ref 98–111)
Creatinine, Ser: 0.72 mg/dL (ref 0.44–1.00)
GFR calc Af Amer: >60 mL/min (ref >60)
GFR calc non Af Amer: >60 mL/min (ref >60)
Glucose, Bld: 104 mg/dL — ABNORMAL HIGH (ref 70–99)
Potassium: 3.7 mmol/L (ref 3.5–5.1)
Sodium: 141 mmol/L (ref 135–145)

## 2020-06-19 LAB — CBC
HCT: 39.5 % (ref 36.0–46.0)
Hemoglobin: 11.9 g/dL — ABNORMAL LOW (ref 12.0–15.0)
MCH: 21.5 pg — ABNORMAL LOW (ref 26.0–34.0)
MCHC: 30.1 g/dL (ref 30.0–36.0)
MCV: 71.4 fL — ABNORMAL LOW (ref 80.0–100.0)
Platelets: 219 10*3/uL (ref 150–400)
RBC: 5.53 MIL/uL — ABNORMAL HIGH (ref 3.87–5.11)
RDW: 17 % — ABNORMAL HIGH (ref 11.5–15.5)
WBC: 9.4 10*3/uL (ref 4.0–10.5)
nRBC: 0 % (ref 0.0–0.2)

## 2020-06-19 LAB — TROPONIN I (HIGH SENSITIVITY): Troponin I (High Sensitivity): 3 ng/L (ref <18)

## 2020-06-19 LAB — POCT PREGNANCY, URINE: Preg Test, Ur: NEGATIVE

## 2020-06-19 NOTE — ED Triage Notes (Addendum)
Pt arrived via POV with c/o elevated blood pressure, pt states she has been feeling bad since yesterday states she took her BP med Propranolol ER 60mg  around 6pm, pt states when she woke up at 130 she had an episode of vomiting. Pt c/o headache that is constant as well.                         Denies any blurred vision  Pt also c/o right shoulder, pain denies chest pain.  Pt states on average she misses 3-4 doses of her propranolol weekly.  Pt also taking sertraline 50mg  daily but frequently missing doses.  Fill date on bottles of meds were May 2021 for #30 pills both with numerous pills in bottle.

## 2020-08-22 ENCOUNTER — Other Ambulatory Visit: Payer: Self-pay

## 2020-08-22 ENCOUNTER — Emergency Department: Payer: Medicaid Other

## 2020-08-22 ENCOUNTER — Emergency Department
Admission: EM | Admit: 2020-08-22 | Discharge: 2020-08-22 | Disposition: A | Discharge status: Home / Self Care | Payer: Medicaid Other | Attending: Emergency Medicine | Admitting: Emergency Medicine

## 2020-08-22 DIAGNOSIS — M25561 Pain in right knee: Secondary | ICD-10-CM | POA: Diagnosis present

## 2020-08-22 DIAGNOSIS — Z79899 Other long term (current) drug therapy: Secondary | ICD-10-CM | POA: Insufficient documentation

## 2020-08-22 DIAGNOSIS — I10 Essential (primary) hypertension: Secondary | ICD-10-CM | POA: Insufficient documentation

## 2020-08-22 DIAGNOSIS — F17210 Nicotine dependence, cigarettes, uncomplicated: Secondary | ICD-10-CM | POA: Diagnosis not present

## 2020-08-22 MED ORDER — HYDROCODONE-ACETAMINOPHEN 5-325 MG PO TABS: 1.0000 | ORAL_TABLET | Freq: Four times a day (QID) | ORAL | 0 refills | Status: DC | PRN

## 2020-08-22 NOTE — Discharge Instructions (Signed)
Call make appointment with Dr. Joice Lofts who is the orthopedist on-call for the [facility] clinic today.  Ice and elevation.  Wear the knee immobilizer for additional support and protection.  A prescription for hydrocodone was sent to your pharmacy to be taken every 6 hours if needed for pain.  You may wear the knee immobilizer until you are seen by the orthopedist for further evaluation.

## 2020-08-22 NOTE — ED Provider Notes (Signed)
[facility] Emergency Department Provider Note  ____________________________________________   None    (approximate)  I have reviewed the triage vital signs and the nursing notes.   HISTORY  Chief Complaint Knee Pain   HPI Laura Watkins is a 29 y.o. female resents to the ED with complaint of right knee pain.  Patient states that she was kneeling on the bed to unplug her charger when her right knee began hurting.  She has had problems in the past with her right knee.  She felt something "pop".  She reports that she is able to bear weight "a little".  She denies any direct trauma to her knee.  She states that she was seen at [facility] orthopedic department several years ago for her right knee but has not been back since.  No over-the-counter medication was taken.  Currently she rates her pain as a 10/10.        Log Past Medical History:  Diagnosis Date  . Anemia   . Hypertension     Patient Active Problem List   Diagnosis Date Noted  . Abdominal pain affecting pregnancy 01/24/2020  . Pregnancy 07/31/2017  . Preterm uterine contractions in third trimester, antepartum 06/03/2017  . Anemia affecting pregnancy in third trimester 05/05/2017  . Normal spontaneous vaginal delivery 06/20/2015    Past Surgical History:  Procedure Laterality Date  . APPENDECTOMY    . COLON SURGERY      Prior to Admission medications   Medication Sig Start Date End Date Taking? Authorizing Provider  lisinopril-hydrochlorothiazide (ZESTORETIC) 10-12.5 MG tablet Take 1 tablet by mouth daily.   Yes [provider]  HYDROcodone-acetaminophen (NORCO/VICODIN) 5-325 MG tablet Take 1 tablet by mouth every 6 (six) hours as needed for moderate pain. 08/22/20   Tommi Rumps, PA-C    Allergies Naproxen sodium, Nsaids, and Naproxen  No family history on file.  Social History Social History   Tobacco Use  . Smoking status: Current Every Day Smoker     Packs/day: 0.25    Types: Cigarettes  . Smokeless tobacco: Never Used  Vaping Use  . Vaping Use: Never used  Substance Use Topics  . Alcohol use: No    Comment: occasional  . Drug use: No    Review of Systems Constitutional: No fever/chills Eyes: No visual changes. Cardiovascular: Denies chest pain. Respiratory: Denies shortness of breath. Gastrointestinal: No abdominal pain.  No nausea, no vomiting.  Musculoskeletal: Positive for right knee pain. Skin: Negative for rash. Neurological: Negative for headaches, focal weakness or numbness. ____________________________________________   PHYSICAL EXAM:  VITAL SIGNS: ED Triage Vitals  Enc Vitals Group     BP 08/22/20 0842 (!) 155/109     Pulse Rate 08/22/20 0842 87     Resp 08/22/20 0842 17     Temp 08/22/20 0842 98.3 F (36.8 C)     Temp Source 08/22/20 0842 Oral     SpO2 08/22/20 0842 99 %     Weight 08/22/20 0836 238 lb (108 kg)     Height 08/22/20 0836 5\' 3"  (1.6 m)     Head Circumference --      Peak Flow --      Pain Score 08/22/20 0836 10     Pain Loc --      Pain Edu? --      Excl. in GC? --     Constitutional: Alert and oriented. Well appearing and in no acute distress. Eyes: Conjunctivae are normal.  Head:  Atraumatic. Neck: No stridor.   Cardiovascular: Normal rate, regular rhythm. Grossly normal heart sounds.  Good peripheral circulation. Respiratory: Normal respiratory effort.  No retractions. Lungs CTAB. Musculoskeletal: On examination of the right knee there is no gross deformity and no effusion is present.  Range of motion is difficult for the patient secondary to discomfort.  Skin without abrasions or discoloration.  Minimal crepitus is appreciated with range of motion.  There is bilateral ligament instability while stressing the joint.  Patient is unable to put full weight on her right lower extremity.  No pitting edema is noted to the lower extremity.  Pulses present.  Motor sensory function intact  distally. Neurologic:  Normal speech and language. No gross focal neurologic deficits are appreciated. No gait instability. Skin:  Skin is warm, dry and intact. No rash noted. Psychiatric: Mood and affect are normal. Speech and behavior are normal.  ____________________________________________   LABS (all labs ordered are listed, but only abnormal results are displayed)  Labs Reviewed - No data to display ____________________________________________   RADIOLOGY   Official radiology report(s): DG Knee Complete 4 Views Right  Result Date: 08/22/2020 CLINICAL DATA:  Acute episode of pain after spending time bending on knees EXAM: RIGHT KNEE - COMPLETE 4+ VIEW COMPARISON:  None. FINDINGS: Frontal, lateral, and bilateral oblique views obtained. There is no fracture or dislocation. No joint effusion. Joint spaces appear unremarkable. No erosive change. IMPRESSION: No fracture, dislocation, or effusion. No appreciable arthropathic change. Electronically Signed   By: Bretta Bang III M.D.   On: 08/22/2020 09:11    ____________________________________________   PROCEDURES  Procedure(s) performed (including Critical Care):  Procedures Knee immobilizer was applied by RN.  ____________________________________________   INITIAL IMPRESSION / ASSESSMENT AND PLAN / ED COURSE  As part of my medical decision making, I reviewed the following data within the electronic MEDICAL RECORD NUMBER Notes from prior ED visits and [place] Controlled Substance Database  29 year old female presents to the ED with complaint of right knee pain after bending over onto the bed to unplug a charger feeling her knee "pop".  She states it is difficult to bear weight since that time.  She does not take any over-the-counter medication.  Patient has had difficulty with her right knee in the past has been evaluated by the orthopedic department at [facility] several years ago.  There is some instability to bilateral  ligaments on exam but no gross deformity.  X-rays were negative for any acute bony injury.  Patient was placed in a knee immobilizer and a prescription for hydrocodone with acetaminophen was prescribed as she cannot take NSAIDs.  She is to ice and elevate.  She is encouraged to call make an appointment for reevaluation with the orthopedist at [facility] since she is already established as a patient there.  ____________________________________________   FINAL CLINICAL IMPRESSION(S) / ED DIAGNOSES  Final diagnoses:  Acute pain of right knee     ED Discharge Orders         Ordered    HYDROcodone-acetaminophen (NORCO/VICODIN) 5-325 MG tablet  Every 6 hours PRN        08/22/20 1045          *Please note:  Laura Watkins was evaluated in Emergency Department on 08/22/2020 for the symptoms described in the history of present illness. She was evaluated in the context of the global COVID-19 pandemic, which necessitated consideration that the patient might be at risk for infection with the SARS-CoV-2 virus that causes COVID-19.  Institutional protocols and algorithms that pertain to the evaluation of patients at risk for COVID-19 are in a state of rapid change based on information released by regulatory bodies including the CDC and federal and state organizations. These policies and algorithms were followed during the patient's care in the ED.  Some ED evaluations and interventions may be delayed as a result of limited staffing during and the pandemic.*   Note:  This document was prepared using Dragon voice recognition software and may include unintentional dictation errors.    Tommi Rumps, PA-C 08/22/20 1155    Sharyn Creamer, MD 08/22/20 1558

## 2020-08-22 NOTE — ED Triage Notes (Signed)
Pt states she was bending down on all fours and felt her right knee pop and is having a lot of pain.

## 2020-08-22 NOTE — ED Notes (Signed)
EDP notified of bp. Pt to follow up with PCP.

## 2020-08-22 NOTE — ED Notes (Signed)
See triage note  Presents with pain to right knee  States she bent her knee to reach something and felt a pop   Unable to bear wt

## 2020-09-12 ENCOUNTER — Other Ambulatory Visit: Payer: Self-pay | Admitting: Orthopedic Surgery

## 2020-09-12 ENCOUNTER — Encounter: Payer: Self-pay | Admitting: Orthopedic Surgery

## 2020-09-12 ENCOUNTER — Other Ambulatory Visit: Payer: Self-pay

## 2020-09-12 ENCOUNTER — Other Ambulatory Visit
Admission: RE | Admit: 2020-09-12 | Discharge: 2020-09-12 | Disposition: A | Discharge status: Home / Self Care | Payer: Medicaid Other | Source: Ambulatory Visit | Attending: Orthopedic Surgery | Admitting: Orthopedic Surgery

## 2020-09-12 DIAGNOSIS — Z01818 Encounter for other preprocedural examination: Secondary | ICD-10-CM | POA: Insufficient documentation

## 2020-09-12 DIAGNOSIS — Z20822 Contact with and (suspected) exposure to covid-19: Secondary | ICD-10-CM | POA: Diagnosis not present

## 2020-09-12 LAB — SARS CORONAVIRUS 2 (TAT 6-24 HRS): SARS Coronavirus 2: NEGATIVE

## 2020-09-13 NOTE — Anesthesia Preprocedure Evaluation (Addendum)
Anesthesia Evaluation  Patient identified by MRN, date of birth, ID band Patient awake    Reviewed: Allergy & Precautions, NPO status , Patient's Chart, lab work & pertinent test results, reviewed documented beta blocker date and time   History of Anesthesia Complications Negative for: history of anesthetic complications  Airway Mallampati: II  TM Distance: >3 FB Neck ROM: Full    Dental  (+)    Pulmonary Patient abstained from smoking., former smoker,    breath sounds clear to auscultation       Cardiovascular hypertension, (-) angina(-) DOE  Rhythm:Regular Rate:Normal     Neuro/Psych    GI/Hepatic neg GERD  ,  Endo/Other  Morbid obesity (BMI 42)  Renal/GU      Musculoskeletal   Abdominal   Peds  Hematology   Anesthesia Other Findings   Reproductive/Obstetrics                           Anesthesia Physical Anesthesia Plan  ASA: III  Anesthesia Plan: General   Post-op Pain Management:    Induction: Intravenous  PONV Risk Score and Plan: 3 and Treatment may vary due to age or medical condition, Ondansetron, Dexamethasone and Midazolam  Airway Management Planned: LMA  Additional Equipment:   Intra-op Plan:   Post-operative Plan: Extubation in OR  Informed Consent: I have reviewed the patients History and Physical, chart, labs and discussed the procedure including the risks, benefits and alternatives for the proposed anesthesia with the patient or authorized representative who has indicated his/her understanding and acceptance.       Plan Discussed with: CRNA and Anesthesiologist  Anesthesia Plan Comments:         Anesthesia Quick Evaluation

## 2020-09-16 ENCOUNTER — Ambulatory Visit: Payer: Medicaid Other | Admitting: Anesthesiology

## 2020-09-16 ENCOUNTER — Encounter
Admission: RE | Disposition: A | Discharge status: Home / Self Care | Payer: Self-pay | Source: Home / Self Care | Attending: Orthopedic Surgery

## 2020-09-16 ENCOUNTER — Encounter: Payer: Self-pay | Admitting: Orthopedic Surgery

## 2020-09-16 ENCOUNTER — Other Ambulatory Visit: Payer: Self-pay

## 2020-09-16 ENCOUNTER — Ambulatory Visit
Admission: RE | Admit: 2020-09-16 | Discharge: 2020-09-16 | Disposition: A | Discharge status: Home / Self Care | Payer: Medicaid Other | Attending: Orthopedic Surgery | Admitting: Orthopedic Surgery

## 2020-09-16 DIAGNOSIS — Z87891 Personal history of nicotine dependence: Secondary | ICD-10-CM | POA: Diagnosis not present

## 2020-09-16 DIAGNOSIS — Z79899 Other long term (current) drug therapy: Secondary | ICD-10-CM | POA: Insufficient documentation

## 2020-09-16 DIAGNOSIS — S83271A Complex tear of lateral meniscus, current injury, right knee, initial encounter: Secondary | ICD-10-CM | POA: Insufficient documentation

## 2020-09-16 DIAGNOSIS — X500XXA Overexertion from strenuous movement or load, initial encounter: Secondary | ICD-10-CM | POA: Insufficient documentation

## 2020-09-16 DIAGNOSIS — M1711 Unilateral primary osteoarthritis, right knee: Secondary | ICD-10-CM | POA: Diagnosis not present

## 2020-09-16 DIAGNOSIS — Y9389 Activity, other specified: Secondary | ICD-10-CM | POA: Diagnosis not present

## 2020-09-16 DIAGNOSIS — I10 Essential (primary) hypertension: Secondary | ICD-10-CM | POA: Insufficient documentation

## 2020-09-16 DIAGNOSIS — S83511A Sprain of anterior cruciate ligament of right knee, initial encounter: Secondary | ICD-10-CM | POA: Insufficient documentation

## 2020-09-16 DIAGNOSIS — S83281A Other tear of lateral meniscus, current injury, right knee, initial encounter: Secondary | ICD-10-CM | POA: Diagnosis present

## 2020-09-16 HISTORY — PX: KNEE ARTHROSCOPY WITH ANTERIOR CRUCIATE LIGAMENT (ACL) REPAIR: SHX5644

## 2020-09-16 LAB — POCT PREGNANCY, URINE: Preg Test, Ur: NEGATIVE

## 2020-09-16 SURGERY — KNEE ARTHROSCOPY WITH ANTERIOR CRUCIATE LIGAMENT (ACL) REPAIR
Anesthesia: General | Site: Knee | Laterality: Right

## 2020-09-16 MED ORDER — HYDRALAZINE HCL 20 MG/ML IJ SOLN
INTRAMUSCULAR | Status: DC | PRN
  Administered 2020-09-16: 5 mg via INTRAVENOUS

## 2020-09-16 MED ORDER — MIDAZOLAM HCL 5 MG/5ML IJ SOLN
INTRAMUSCULAR | Status: DC | PRN
  Administered 2020-09-16: 2 mg via INTRAVENOUS

## 2020-09-16 MED ORDER — PROMETHAZINE HCL 25 MG/ML IJ SOLN: 6.2500 mg | INTRAMUSCULAR | Status: DC | PRN

## 2020-09-16 MED ORDER — ROPIVACAINE HCL 5 MG/ML IJ SOLN
INTRAMUSCULAR | Status: DC | PRN
  Administered 2020-09-16: 20 mL via EPIDURAL

## 2020-09-16 MED ORDER — OXYCODONE HCL 5 MG PO TABS
5.0000 mg | ORAL_TABLET | ORAL | 0 refills | Status: DC | PRN
Start: 2020-09-16 — End: 2021-01-20

## 2020-09-16 MED ORDER — ACETAMINOPHEN 10 MG/ML IV SOLN
INTRAVENOUS | Status: DC | PRN
  Administered 2020-09-16: 1000 mg via INTRAVENOUS

## 2020-09-16 MED ORDER — ONDANSETRON 4 MG PO TBDP: 4.0000 mg | ORAL_TABLET | Freq: Three times a day (TID) | ORAL | 0 refills | Status: DC | PRN

## 2020-09-16 MED ORDER — GLYCOPYRROLATE 0.2 MG/ML IJ SOLN
INTRAMUSCULAR | Status: DC | PRN
  Administered 2020-09-16: .2 mg via INTRAVENOUS

## 2020-09-16 MED ORDER — DEXMEDETOMIDINE HCL 200 MCG/2ML IV SOLN
INTRAVENOUS | Status: DC | PRN
  Administered 2020-09-16: 5 ug via INTRAVENOUS
  Administered 2020-09-16: 10 ug via INTRAVENOUS

## 2020-09-16 MED ORDER — LIDOCAINE-EPINEPHRINE 1 %-1:100000 IJ SOLN
INTRAMUSCULAR | Status: DC | PRN
  Administered 2020-09-16: 8 mL via INTRAMUSCULAR
  Administered 2020-09-16: 11 mL via INTRAMUSCULAR

## 2020-09-16 MED ORDER — ASPIRIN EC 325 MG PO TBEC: 325.0000 mg | DELAYED_RELEASE_TABLET | Freq: Every day | ORAL | 0 refills | Status: AC

## 2020-09-16 MED ORDER — MEPERIDINE HCL 25 MG/ML IJ SOLN: 6.2500 mg | INTRAMUSCULAR | Status: DC | PRN

## 2020-09-16 MED ORDER — FENTANYL CITRATE (PF) 100 MCG/2ML IJ SOLN
INTRAMUSCULAR | Status: DC | PRN
  Administered 2020-09-16: 100 ug via INTRAVENOUS
  Administered 2020-09-16 (×3): 25 ug via INTRAVENOUS
  Administered 2020-09-16: 50 ug via INTRAVENOUS
  Administered 2020-09-16: 25 ug via INTRAVENOUS

## 2020-09-16 MED ORDER — OXYCODONE HCL 5 MG PO TABS
5.0000 mg | ORAL_TABLET | Freq: Once | ORAL | Status: AC | PRN
  Administered 2020-09-16: 5 mg via ORAL

## 2020-09-16 MED ORDER — LIDOCAINE HCL (CARDIAC) PF 100 MG/5ML IV SOSY
PREFILLED_SYRINGE | INTRAVENOUS | Status: DC | PRN
  Administered 2020-09-16: 30 mg via INTRATRACHEAL

## 2020-09-16 MED ORDER — DEXAMETHASONE SODIUM PHOSPHATE 4 MG/ML IJ SOLN
INTRAMUSCULAR | Status: DC | PRN
  Administered 2020-09-16: 4 mg via INTRAVENOUS

## 2020-09-16 MED ORDER — ONDANSETRON HCL 4 MG/2ML IJ SOLN: 4.0000 mg | Freq: Four times a day (QID) | INTRAMUSCULAR | Status: DC | PRN

## 2020-09-16 MED ORDER — LABETALOL HCL 5 MG/ML IV SOLN
INTRAVENOUS | Status: DC | PRN
  Administered 2020-09-16 (×3): 5 mg via INTRAVENOUS

## 2020-09-16 MED ORDER — OXYCODONE HCL 5 MG/5ML PO SOLN: 5.0000 mg | Freq: Once | ORAL | Status: AC | PRN

## 2020-09-16 MED ORDER — CEFAZOLIN SODIUM-DEXTROSE 2-4 GM/100ML-% IV SOLN
2.0000 g | INTRAVENOUS | Status: AC
  Administered 2020-09-16: 2 g via INTRAVENOUS

## 2020-09-16 MED ORDER — ACETAMINOPHEN 500 MG PO TABS: 1000.0000 mg | ORAL_TABLET | Freq: Three times a day (TID) | ORAL | 2 refills | Status: AC

## 2020-09-16 MED ORDER — HYDROMORPHONE HCL 1 MG/ML IJ SOLN
0.2500 mg | INTRAMUSCULAR | Status: DC | PRN
  Administered 2020-09-16 (×2): 0.25 mg via INTRAVENOUS

## 2020-09-16 MED ORDER — LACTATED RINGERS IV SOLN: INTRAVENOUS | Status: DC

## 2020-09-16 MED ORDER — PROPOFOL 10 MG/ML IV BOLUS
INTRAVENOUS | Status: DC | PRN
  Administered 2020-09-16: 200 mg via INTRAVENOUS

## 2020-09-16 MED ORDER — VANCOMYCIN HCL 500 MG IV SOLR
INTRAVENOUS | Status: DC | PRN
  Administered 2020-09-16: 500 mg

## 2020-09-16 MED ORDER — ONDANSETRON HCL 4 MG/2ML IJ SOLN
INTRAMUSCULAR | Status: DC | PRN
  Administered 2020-09-16: 4 mg via INTRAVENOUS

## 2020-09-16 SURGICAL SUPPLY — 90 items
ADAPTER IRRIG TUBE 2 SPIKE SOL (ADAPTER) ×6 IMPLANT
ADH SKN CLS APL DERMABOND .7 (GAUZE/BANDAGES/DRESSINGS) ×1
ADPR TBG 2 SPK PMP STRL ASCP (ADAPTER) ×2
APL PRP STRL LF DISP 70% ISPRP (MISCELLANEOUS) ×1
APL SWBSTK 6 STRL LF DISP (MISCELLANEOUS) ×1
APPLICATOR COTTON TIP 6 STRL (MISCELLANEOUS) ×1 IMPLANT
APPLICATOR COTTON TIP 6IN STRL (MISCELLANEOUS) ×3
BASIN GRAD PLASTIC 32OZ STRL (MISCELLANEOUS) ×3 IMPLANT
BLADE CUTTING QUAD TENDON 10 (BLADE) ×2 IMPLANT
BLADE CUTTING QUAD TENDON 10MM (BLADE) ×1
BLADE SURG 15 STRL LF DISP TIS (BLADE) ×2 IMPLANT
BLADE SURG 15 STRL SS (BLADE) ×6
BLADE SURG SZ10 CARB STEEL (BLADE) ×3 IMPLANT
BLADE SURG SZ11 CARB STEEL (BLADE) ×3 IMPLANT
BNDG COHESIVE 4X5 TAN STRL (GAUZE/BANDAGES/DRESSINGS) ×3 IMPLANT
BNDG COHESIVE 6X5 TAN STRL LF (GAUZE/BANDAGES/DRESSINGS) ×3 IMPLANT
BNDG ESMARK 6X12 TAN STRL LF (GAUZE/BANDAGES/DRESSINGS) ×3 IMPLANT
BOWL GRADUATED 16OZ PLASTIC (MISCELLANEOUS) ×6 IMPLANT
BUR BR 5.5 12 FLUTE (BURR) ×3 IMPLANT
BUR RADIUS 3.5 (BURR) IMPLANT
BUR RADIUS 4.0X18.5 (BURR) ×3 IMPLANT
CHLORAPREP W/TINT 26 (MISCELLANEOUS) ×3 IMPLANT
COOLER POLAR GLACIER W/PUMP (MISCELLANEOUS) ×3 IMPLANT
COVER LIGHT HANDLE UNIVERSAL (MISCELLANEOUS) ×6 IMPLANT
COVER MAYO STAND STRL (DRAPES) ×3 IMPLANT
CUFF TOURN SGL QUICK 34 (TOURNIQUET CUFF) ×3
CUFF TRNQT CYL 34X4.125X (TOURNIQUET CUFF) ×1 IMPLANT
DERMABOND ADVANCED (GAUZE/BANDAGES/DRESSINGS) ×2
DERMABOND ADVANCED .7 DNX12 (GAUZE/BANDAGES/DRESSINGS) ×1 IMPLANT
DRAPE C-ARM XRAY 36X54 (DRAPES) ×3 IMPLANT
DRAPE FLUOR MINI C-ARM 54X84 (DRAPES) ×3 IMPLANT
DRAPE IMP U-DRAPE 54X76 (DRAPES) ×3 IMPLANT
DRAPE SHEET LG 3/4 BI-LAMINATE (DRAPES) ×12 IMPLANT
DRAPE TABLE BACK 80X90 (DRAPES) ×3 IMPLANT
DRILL FLIPCUTTER III 6-12 (ORTHOPEDIC DISPOSABLE SUPPLIES) ×1 IMPLANT
ELECT REM PT RETURN 9FT ADLT (ELECTROSURGICAL) ×3
ELECTRODE REM PT RTRN 9FT ADLT (ELECTROSURGICAL) ×1 IMPLANT
FLIPCUTTER III 6-12 AR-1204FF (ORTHOPEDIC DISPOSABLE SUPPLIES) ×3
GAUZE SPONGE 4X4 12PLY STRL (GAUZE/BANDAGES/DRESSINGS) ×3 IMPLANT
GLOVE BIO SURGEON STRL SZ7.5 (GLOVE) ×18 IMPLANT
GLOVE BIOGEL PI IND STRL 8 (GLOVE) ×4 IMPLANT
GLOVE BIOGEL PI INDICATOR 8 (GLOVE) ×8
GOWN STRL REIN 2XL XLG LVL4 (GOWN DISPOSABLE) ×3 IMPLANT
GOWN STRL REUS W/ TWL LRG LVL3 (GOWN DISPOSABLE) ×3 IMPLANT
GOWN STRL REUS W/TWL LRG LVL3 (GOWN DISPOSABLE) ×9
IMP SYS 2ND FIX PEEK 4.75X19.1 (Miscellaneous) ×3 IMPLANT
IMPL SYS 2ND FX PEEK 4.75X19.1 (Miscellaneous) ×1 IMPLANT
IMPL SYS MENISCAL ROOT REPAIR (Orthopedic Implant) ×3 IMPLANT
IMPL TIGHTROP ABS ACL FIBERTG (Orthopedic Implant) ×1 IMPLANT
IMPL TIGHTROP FIBERTAG ACL (Orthopedic Implant) ×1 IMPLANT
IMPL TIGHTROPE ABS ACL FIBERTG (Orthopedic Implant) ×3 IMPLANT
IMPLANT TIGHTROPE FIBERTAG ACL (Orthopedic Implant) ×3 IMPLANT
IV LACTATED RINGER IRRG 3000ML (IV SOLUTION) ×102
IV LR IRRIG 3000ML ARTHROMATIC (IV SOLUTION) ×34 IMPLANT
KIT TRANSTIBIAL (DISPOSABLE) ×3 IMPLANT
KIT TURNOVER KIT A (KITS) ×3 IMPLANT
MANIFOLD NEPTUNE II (INSTRUMENTS) ×3 IMPLANT
MAT ABSORB  FLUID 56X50 GRAY (MISCELLANEOUS) ×6
MAT ABSORB FLUID 56X50 GRAY (MISCELLANEOUS) ×3 IMPLANT
NEEDLE HYPO 21X1.5 SAFETY (NEEDLE) ×3 IMPLANT
NS IRRIG 500ML POUR BTL (IV SOLUTION) ×3 IMPLANT
PACK ARTHROSCOPY KNEE (MISCELLANEOUS) ×3 IMPLANT
PAD ABD DERMACEA PRESS 5X9 (GAUZE/BANDAGES/DRESSINGS) ×3 IMPLANT
PAD WRAPON POLAR KNEE (MISCELLANEOUS) ×1 IMPLANT
PADDING CAST BLEND 6X4 STRL (MISCELLANEOUS) ×1 IMPLANT
PADDING STRL CAST 6IN (MISCELLANEOUS) ×2
PENCIL SMOKE EVACUATOR (MISCELLANEOUS) ×3 IMPLANT
REAMER LOW PROFILE 10MM (INSTRUMENTS) ×3 IMPLANT
SCREW BIO INTER 8X23 (Screw) ×3 IMPLANT
SET TUBE SUCT SHAVER OUTFL 24K (TUBING) ×3 IMPLANT
SET TUBE TIP INTRA-ARTICULAR (MISCELLANEOUS) ×3 IMPLANT
SPONGE LAP 18X18 RF (DISPOSABLE) ×3 IMPLANT
SUT ETHILON 3-0 FS-10 30 BLK (SUTURE) ×3
SUT FIBERSNARE 2 CLSD LOOP (SUTURE) ×6 IMPLANT
SUT FIBERWIRE #2 38 T-5 BLUE (SUTURE) ×3
SUT MNCRL 4-0 (SUTURE) ×6
SUT MNCRL 4-0 27XMFL (SUTURE) ×2
SUT VIC AB 0 CT1 36 (SUTURE) ×3 IMPLANT
SUT VIC AB 2-0 CT2 27 (SUTURE) ×12 IMPLANT
SUTURE EHLN 3-0 FS-10 30 BLK (SUTURE) ×1 IMPLANT
SUTURE FIBERWR #2 38 T-5 BLUE (SUTURE) ×1 IMPLANT
SUTURE MNCRL 4-0 27XMF (SUTURE) ×2 IMPLANT
SUTURE TAPE FIBERLINK 1.3 LOOP (SUTURE) ×1 IMPLANT
SUTURETAPE FIBERLINK 1.3 LOOP (SUTURE) ×3
SYR BULB IRRIG 60ML STRL (SYRINGE) ×3 IMPLANT
TOWEL OR 17X26 4PK STRL BLUE (TOWEL DISPOSABLE) ×3 IMPLANT
TRAY FOLEY SLVR 16FR LF STAT (SET/KITS/TRAYS/PACK) ×3 IMPLANT
TUBING ARTHRO INFLOW-ONLY STRL (TUBING) ×3 IMPLANT
WAND WEREWOLF FLOW 90D (MISCELLANEOUS) ×3 IMPLANT
WRAPON POLAR PAD KNEE (MISCELLANEOUS) ×3

## 2020-09-16 NOTE — Progress Notes (Signed)
Assisted [facility] ANMD with right, ultrasound guided, adductor canal block. Side rails up, monitors on throughout procedure. See vital signs in flow sheet. Tolerated Procedure well.

## 2020-09-16 NOTE — Discharge Instructions (Signed)
Arthroscopic ACL Surgery with Meniscus Repair   Post-Op Instructions   1. Bracing or crutches: Crutches will be provided at the time of discharge from the surgery center.    2. Ice: You may be provided with a device [facility]) that allows you to ice the affected area effectively. Otherwise you can ice manually.   3. Driving:  Driving: Off all narcotic pain meds when operating vehicle   1 week for automatic cars, left leg surgery  4 weeks for standard/manual cars or right leg surgery   4. Activity: Ankle pumps several times an hour while awake to prevent blood clots. Weight bearing: NO weight bearing x 4 weeks. The brace should not be unlocked in order to protect the meniscus repair. Unlock only for hygiene and for exercises as directed by physical therapist. Elevate knee above heart level as much as possible for one week. Avoid standing more than 5 minutes (consecutively) for the first week. No exercise involving the knee until cleared by the surgeon or physical therapist. Ideally, you should avoid long distance travel for 4 weeks.   5. Medications:  - You have been provided a prescription for narcotic pain medicine. After surgery, take 1-2 narcotic tablets every 4 hours if needed for severe pain.  - A prescription for anti-nausea medication will be provided in case the narcotic medicine causes nausea - take 1 tablet every 6 hours only if nauseated.  - Take ibuprofen 800 mg every 8 hours with food to reduce post-operative knee swelling. DO NOT STOP IBUPROFEN POST-OP UNTIL INSTRUCTED TO DO SO at first post-op office visit (10-14 days after surgery).  - Take enteric coated aspirin 325 mg once daily for 2 weeks to prevent blood clots.  - Take tylenol 1000mg  (two extra strength tablets) every 8 hours for pain.  May stop tylenol 5 days after surgery if you are having minimal pain. - You can take an over the counter stool softener to help with narcotic related constipation (Colace, Senna, Miralax,  etc.)   If you are taking prescription medication for anxiety, depression, insomnia, muscle spasm, chronic pain, or for attention deficit disorder you are advised that you are at a higher risk of adverse effects with use of narcotics post-op, including narcotic addiction/dependence, depressed breathing, death. If you use non-prescribed substances: alcohol, marijuana, cocaine, heroin, methamphetamines, etc., you are at a higher risk of adverse effects with use of narcotics post-op, including narcotic addiction/dependence, depressed breathing, death. You are advised that taking > 50 morphine milligram equivalents (MME) of narcotic pain medication per day results in twice the risk of overdose or death. For your prescription provided: oxycodone 5 mg - taking more than 6 tablets per day. Be advised that we will prescribe narcotics short-term, for acute post-operative pain only - 1 week for minor operations such as knee arthroscopy for meniscus tear resection, and 3 weeks for major operations such as knee repair/reconstruction surgeries.   6. Bandages: The physical therapist should change the bandages at the first post-op appointment. If needed, the dressing supplies have been provided to you.   7. Physical Therapy: 2 times per week for the first 4 weeks, then 1-2 times per week from weeks 4-8 post-op. Therapy typically starts on post operative Day 3 or 4. You have been provided an order for physical therapy. The therapist will provide home exercises.   8. Work/School: May return when able to tolerate standing for greater than 2 hours and off of narcotic pain medications. Can return to school usually  in ~1-2 weeks.    9. Post-Op Appointments: Your first post-op appointment will be with Dr. Allena Katz in approximately 2 weeks time.    If you find that they have not been scheduled please call the Orthopaedic Appointment front desk at [phone].        General Anesthesia, Adult, Care After This sheet  gives you information about how to care for yourself after your procedure. Your health care provider may also give you more specific instructions. If you have problems or questions, contact your health care provider. What can I expect after the procedure? After the procedure, the following side effects are common:  Pain or discomfort at the IV site.  Nausea.  Vomiting.  Sore throat.  Trouble concentrating.  Feeling cold or chills.  Weak or tired.  Sleepiness and fatigue.  Soreness and body aches. These side effects can affect parts of the body that were not involved in surgery. Follow these instructions at home:  For at least 24 hours after the procedure:  Have a responsible adult stay with you. It is important to have someone help care for you until you are awake and alert.  Rest as needed.  Do not: ? Participate in activities in which you could fall or become injured. ? Drive. ? Use heavy machinery. ? Drink alcohol. ? Take sleeping pills or medicines that cause drowsiness. ? Make important decisions or sign legal documents. ? Take care of children on your own. Eating and drinking  Follow any instructions from your health care provider about eating or drinking restrictions.  When you feel hungry, start by eating small amounts of foods that are soft and easy to digest (bland), such as toast. Gradually return to your regular diet.  Drink enough fluid to keep your urine pale yellow.  If you vomit, rehydrate by drinking water, juice, or clear broth. General instructions  If you have sleep apnea, surgery and certain medicines can increase your risk for breathing problems. Follow instructions from your health care provider about wearing your sleep device: ? Anytime you are sleeping, including during daytime naps. ? While taking prescription pain medicines, sleeping medicines, or medicines that make you drowsy.  Return to your normal activities as told by your health  care provider. Ask your health care provider what activities are safe for you.  Take over-the-counter and prescription medicines only as told by your health care provider.  If you smoke, do not smoke without supervision.  Keep all follow-up visits as told by your health care provider. This is important. Contact a health care provider if:  You have nausea or vomiting that does not get better with medicine.  You cannot eat or drink without vomiting.  You have pain that does not get better with medicine.  You are unable to pass urine.  You develop a skin rash.  You have a fever.  You have redness around your IV site that gets worse. Get help right away if:  You have difficulty breathing.  You have chest pain.  You have blood in your urine or stool, or you vomit blood. Summary  After the procedure, it is common to have a sore throat or nausea. It is also common to feel tired.  Have a responsible adult stay with you for the first 24 hours after general anesthesia. It is important to have someone help care for you until you are awake and alert.  When you feel hungry, start by eating small amounts of foods that are soft  and easy to digest (bland), such as toast. Gradually return to your regular diet.  Drink enough fluid to keep your urine pale yellow.  Return to your normal activities as told by your health care provider. Ask your health care provider what activities are safe for you. This information is not intended to replace advice given to you by your health care provider. Make sure you discuss any questions you have with your health care provider. Document Revised: 11/12/2017 Document Reviewed: 06/25/2017 [employer] Patient Education  2020 [employer]  

## 2020-09-16 NOTE — Op Note (Addendum)
Operative Note    SURGERY DATE: 09/16/2020   PRE-OP DIAGNOSIS:  1. Right knee anterior cruciate ligament tear 2. Right medial meniscus tear 3. Right lateral meniscus tear 4. Right knee patellofemoral degenerative changes.    POST-OP DIAGNOSIS:  1. Right knee anterior cruciate ligament tear 2. Right lateral meniscus tear 3. Right knee patellofemoral degenerative changes.   PROCEDURES:  1. Right knee anterior cruciate ligament reconstruction with quadriceps tendon autograft 2. Right lateral meniscus repair 3. Right knee chondroplasty of the patellofemoral joint (trochlea)   SURGEON: Rosealee Albee, MD  ASSISTANT: Dedra Skeens, PA   ANESTHESIA: Gen + regional nerve block   ESTIMATED BLOOD LOSS: 50cc   TOTAL IV FLUIDS: per anesthesia  INDICATION(S):  Laura Watkins is a 29 y.o. female who felt a pop in her knee while reaching to get something under a table. MRI showed an ACL tear, possible medial meniscus tear, and lateral meniscus tear, which was consistent with the clinical exam.  We discussed risks of surgery including but not limited to possible ACL and/or meniscus re-tear, infection, bleeding, muscle/nerve damage, DVT, complications of anesthesia, and postoperative knee pain and arthrofibrosis.  We also discussed that if meniscus repair was performed, she would likely have weightbearing restrictions that she would be able to maintain.  After discussion of risks, benefits, and alternatives to surgery, the patient and family elected to proceed.  After discussion of risks, benefits, and alternatives to surgery, the patient elected to proceed.     OPERATIVE FINDINGS:    Examination under anesthesia: A careful examination under anesthesia was performed.  Passive range of motion was: Hyperextension: 2.  Extension: 0.  Flexion: 125.  Lachman: 2B. Pivot Shift: grade 2.  Posterior drawer: normal.  Varus stability in full extension: normal.  Varus stability in 30 degrees of flexion:  normal.  Valgus stability in full extension: normal.  Valgus stability in 30 degrees of flexion: normal.   Intra-operative findings: A thorough arthroscopic examination of the knee was performed.  The findings are: 1. Suprapatellar pouch: Normal 2. Undersurface of median ridge: Normal 3. Medial patellar facet: Grade 2 degenerative changes 4. Lateral patellar facet: Normal 5. Trochlea: Central area of grade 4 degenerative changes and medial trochlear region of grade 2-3 degenerative changes 6. Lateral gutter/popliteus tendon: Normal 7. Hoffa's fat pad: Normal 8. Medial gutter/plica: Normal 9. ACL: Abnormal: complete femoral avulsion 10. PCL: Normal 11. Medial meniscus: Normal 12. Medial compartment cartilage: Normal 13. Lateral meniscus: Radial/parrot-beak type tear of the posterior horn starting anteriorly approximately 1 cm lateral to the posterior horn meniscus root and tear exiting posteriorly at the level of the meniscus root.  There was an additional radial tear of the meniscus body affecting approximately 40% of the meniscus width 14. Lateral compartment cartilage: Grade 1-2 degenerative changes to the tibial plateau; normal femoral condyle   OPERATIVE REPORT:    I identified Carollee Sires in the pre-operative holding area.  I marked the operative knee with my initials. I reviewed the risks and benefits of the proposed surgical intervention and the patient (and/or patient's guardian) wished to proceed.  Anesthesia was then performed with an adductor canal nerve block.  The patient was transferred to the operative suite and placed in the supine position with all bony prominences padded.  Care was taken to ensure that the contralateral leg was placed in neutral position and that the operative leg was well-padded in the leg holder.     Appropriate IV antibiotics were administered within 30 minutes of  incision. The extremity was then prepped and draped in standard fashion. A time out was  performed confirming the correct extremity, correct patient and correct procedure.   Given the clear presence of a pivot shift on examination under anesthesia, I first directed my attention to the harvest of a quadriceps autograft.  The right lower extremity was exsanguinated with an Esmarch, and a thigh tourniquet was elevated to 300 mmHg.  The total tourniquet time for this case was 140 minutes.     A 4cm incision was planned just proximal to the proximal pole of the patella.  The incision was made with a 15 blade, and subcutaneous fat was sharply excised to expose the quadriceps tendon.  The paratenon was sharply incised, and the space in between the paratenon and the quadriceps tendon was bluntly developed with a sponge and a key elevator.  A speculum retractor was placed anteriorly, and the quadriceps was easily visualized with the arthroscope.  The vastus lateralis and VMO were clearly identified, as was the junction of the rectus femoris muscle with the proximal aspect of the quadriceps tendon.  Under direct visualization with the arthroscope, an Arthrex 10 mm parallel blade was used to incise the quadriceps tendon from its most proximal extent, to the junction with the patella.  Care was taken not to violate the rectus femoris muscle.  Then, using a 15 blade, the graft was transected and elevated distally off the patella, creating a 7 mm thick partial thickness graft.  Dissection was carried proximally to create a uniformly thick graft, 70 mm in length.  The distal end of the graft was controlled with a #2 Fiberwire stitch, and the Arthrex quadriceps harvester/cutter was loaded over the graft.  At a length of 70 mm, the harvest/cutter was used to transect the graft proximally, and the graft was removed from the wound.  There is an area where the capsule had been violated.  This was repaired with 2-0 Vicryl.   On the back table, the graft was prepared in standard fashion.  The length of the graft was 70  mm.  Each end was prepared using an Warden/ranger.  The femoral end was secured around a TightRope RT, and the tibial end was secured around an ABS loop.  However, the ABS loop did not deploy properly and was undone so plan was then to use interference screw for fixation of the tibia. The femoral end of the graft was 27mm in diameter, the tibial end was 35mm in diameter.  The graft was tensioned to 20 lbs and reserved for later use.  The graft was placed in a graft tube and tensioned to 20 lbs and reserved for later use.  Of note, it was soaked in 5 mg/mL vancomycin solution to reduce risk of infection for at least 20 minutes prior to implantation.   Standard anterolateral portals was created with an 11-blade.  The arthroscope was introduced through the anterolateral portal, and a full diagnostic arthroscopy was performed as described above.   An anteromedial portal was made under needle localization. A shaver was introduced through the anteromedial portal and used to gently debride the fat pad to improve visualization. Then the ACL remnant was debrided using the shaver, leaving 1-2 mm stumps on the tibia for anatomic referencing.  The lateral meniscus was addressed first. A combination of arthroscopic biters and an oscillating shaver was used to debride the radial tear of the body that involved only the white-white zone.  The lateral stump  of the posterior horn tear was too small to hold any sutures.  Therefore this tear pattern was treated like a meniscus root tear.  An Arthrex meniscus root aiming guide was placed into the anterolateral portal, and this was used to mark out the tibial incision. An approximately 5cm vertical incision was made medial to the tibial tubercle just lateral to the tibial tubercle in order to avoid convergence with the ACL tibial tunnel.  Dissection was carried down to the bone after making a small nick in the anterior compartment fascia.. An elevator was used to clear  periosteum from the tibia in the area of the anticipated bone tunnel. The guide was reinserted into the tibia and was placed over the anatomic footprint of the lateral meniscus root. We then used a FlipCutter to drill into the tibia and create a 17mm socket for the meniscus root. A FiberStick was passed through our tibial tunnel and into the joint. This was retrieved and passed through the anterolateral portal.   Next, using a Meniscus Scorpion, an 0-FiberLink suture was passed just lateral to the meniscus root in a luggage tag configuration.  A second stitch was passed in a similar fashion lateral to the first stitch.  This allowed for excellent purchase of the meniscus root.  We ensured there was no soft tissue bridge between the sutures and pulled the passing stitch from the FiberStick through the anteromedial portal. We then used the passing stitch to bring the sutures in the meniscus out through the tibial tunnel.   Next, I created the femoral socket for the ACL. This was performed with an outside-in technique using an Teacher, English as a foreign language. The femoral guide was placed on the footprint and the guide indicated where the lateral femoral incision should be. We made this incision with a 15 blade and followed the angle of the drill sleeve to make the incision through the IT band down to bone. The drill sleeve was pushed down to bone on the lateral femoral condyle and the guide was placed on the anatomic footprint of the ACL. We drilled a 17mm tunnel that was 14mm in length. We then used a FiberStick to pass a suture through the femoral tunnel and out of the anteromedial portal.  In order to this, the incision had to be extended slightly as aperture on the lateral femoral cortex was lost and had to be regained under direct visualization.   I then directed my attention to preparation of the tibial tunnel. A tibial guide set at 65 degrees was inserted through the anteromedial portal and centered over the tibial  footprint.  The drill sleeve was then advanced to the proximal medial tibia just at the junction of the tibial tubercle and the pes tendon, through a ~4cm incision that was carried down to the anteromedial proximal tibia.  The anticipated tunnel length was 77mm.  A guide pin was then drilled through the proximal tibia under direct arthroscopic visualization into the center of the ACL footprint.  This was then over-reamed with an Arthrex 88mm reamer.  Bony debris and soft tissue was cleared from the metaphyseal opening with electrocautery and from the intra-articular aperture with a shaver.   The passing suture was then brought out of the tibial tunnel.  The graft was then advanced into place in standard fashion.  The femoral Tight Rope was deployed on the lateral cortex under direct arthroscopic visualization from the anteromedial portal.  Correct position on the lateral cortex was confirmed fluoroscopically.  The TightRope was  then shortened until at least 20 mm of graft was in the femoral tunnel.     I then directed my attention to tibial fixation.  This was performed with the knee in full extension with an axial and posterior drawer load applied to the tibia.  A nitinol guidewire was placed anteriorly in the tibial tunnel.  An 8 x 23 mm Arthrex BioComposite interference screw was placed over the nitinol wire.  The knee was cycled 20 times, and the femoral button was tightened as much as possible with the knee in full extension. A hole for a 4.75 mm SwiveLock was drilled approximately 2 cm distal to the tibial tunnel.  The ends of the tibial sutures were placed under tension and the anchor was advanced.  This served as a back-up tibial fixation.   A repeat examination under anesthesia was performed.  The patient retained full hyperextension and flexion.  The Lachman's was normalized.  The arthroscope was re-introduced into the knee joint, confirming excellent position and tension of the quadriceps autograft.   There was no lateral wall or roof impingement.  Tibial and femoral sutures were cut.  Next, fixation of the lateral meniscus root tear was performed.  Previously passed sutures through the posterior horn of the lateral meniscus were then passed through an Kohl'srthrex SwiveLock anchor. Anchor hole was drilled ~2cm distal to the previously drilled tibial tunnel. Anchor was inserted with appropriate tension while visualizing the repair with the arthroscope, and this achieved excellent interference fit. The meniscus root was probed and found to be stable.    The wounds were irrigated.  0 Vicryl was used to close the IT band split and to place deep fat sutures to close the dead space in all of the open incisions.  2-0 Vicryl was used to close the subdermal layers of the quadriceps tendon harvest incision, both tibial tunnel incisions, and the lateral femoral incision.  The skin on these incisions was then closed with 4-0 Monocryl and Dermabond.  The arthroscopy portals were closed with 3-0 Nylon.  A sterile dressing was applied, followed by a Polar Care device and a hinged knee brace locked in full extension.   The patient was awakened from anesthesia without difficulty and was transferred to the PACU in stable condition.   Of note, assistance from a PA was essential to performing the surgery.  PA was present for the entire surgery.  PA assisted with patient positioning, retraction, instrumentation, and wound closure. The surgery would have been more difficult and had longer operative time without PA assistance.   Additionally, this case had increased complexity compared to standard meniscus repair given that the patient had a complete tear of the meniscus root. Repair of this tear involved making a separate open incision, drilling a tunnel through the tibia and using an implant in the tibia for fixation, all of which would otherwise not occur for standard meniscal repairs.  The steps increased surgical time by  approximately 30 minutes.   POSTOPERATIVE PLAN: The patient will be discharged home today once they meet PACU criteria. NWB x 4 weeks. Use ACL Reconstruction with Meniscus Root Repair Rehab Protocol. Aspirin 325 mg daily for 4 weeks for DVT prophylaxis. Patient instructed to stop immediately if seh has any allergic symptoms to this given her NSAID allergy. Start physical therapy on POD#3-4. Follow up in 2 weeks per protocol.

## 2020-09-16 NOTE — Anesthesia Procedure Notes (Signed)
Anesthesia Regional Block: Adductor canal block   Pre-Anesthetic Checklist: ,, timeout performed, Correct Patient, Correct Site, Correct Laterality, Correct Procedure, Correct Position, site marked, Risks and benefits discussed,  Surgical consent,  Pre-op evaluation,  At surgeon's request and post-op pain management  Laterality: Right  Prep: chloraprep       Needles:  Injection technique: Single-shot  Needle Type: Echogenic Needle     Needle Length: 9cm  Needle Gauge: 21     Additional Needles:   Procedures:,,,, ultrasound used (permanent image in chart),,,,  Narrative:  Injection made incrementally with aspirations every 5 mL.  Performed by: Personally  Anesthesiologist: Danett Palazzo A, MD  Additional Notes: Functioning IV was confirmed and monitors applied. Ultrasound guidance: relevant anatomy identified, needle position confirmed, local anesthetic spread visualized around nerve(s)., vascular puncture avoided.  Image printed for medical record.  Negative aspiration and no paresthesias; incremental administration of local anesthetic. The patient tolerated the procedure well. Vitals signes recorded in RN notes.      

## 2020-09-16 NOTE — Anesthesia Postprocedure Evaluation (Signed)
Anesthesia Post Note  Patient: Corretta Munce  Procedure(s) Performed: Right ACL reconstruction using quadriceps tendon autograft with lateral meniscal root  repair and chondroplasty (Right Knee)     Patient location during evaluation: PACU Anesthesia Type: General Level of consciousness: awake and alert Pain management: pain level controlled Vital Signs Assessment: post-procedure vital signs reviewed and stable Respiratory status: spontaneous breathing, nonlabored ventilation, respiratory function stable and patient connected to nasal cannula oxygen Cardiovascular status: blood pressure returned to baseline and stable Postop Assessment: no apparent nausea or vomiting Anesthetic complications: no   No complications documented.  [place] Moyd A  Rosette Bellavance

## 2020-09-16 NOTE — Transfer of Care (Signed)
Immediate Anesthesia Transfer of Care Note  Patient: Jeaneane Adamec  Procedure(s) Performed: Right ACL reconstruction using quadriceps tendon autograft with lateral meniscal root  repair and chondroplasty (Right Knee)  Patient Location: PACU  Anesthesia Type: General  Level of Consciousness: awake, alert  and patient cooperative  Airway and Oxygen Therapy: Patient Spontanous Breathing and Patient connected to supplemental oxygen  Post-op Assessment: Post-op Vital signs reviewed, Patient's Cardiovascular Status Stable, Respiratory Function Stable, Patent Airway and No signs of Nausea or vomiting  Post-op Vital Signs: Reviewed and stable  Complications: No complications documented.

## 2020-09-16 NOTE — Anesthesia Procedure Notes (Signed)
Procedure Name: LMA Insertion Date/Time: 09/16/2020 7:46 AM Performed by: Maree Krabbe, CRNA Pre-anesthesia Checklist: Patient identified, Emergency Drugs available, Suction available, Timeout performed and Patient being monitored Patient Re-evaluated:Patient Re-evaluated prior to induction Oxygen Delivery Method: Circle system utilized Preoxygenation: Pre-oxygenation with 100% oxygen Induction Type: IV induction LMA: LMA inserted LMA Size: 4.0 Number of attempts: 1 Placement Confirmation: positive ETCO2 and breath sounds checked- equal and bilateral Tube secured with: Tape Dental Injury: Teeth and Oropharynx as per pre-operative assessment

## 2020-09-16 NOTE — H&P (Signed)
Paper H&P to be scanned into permanent record. H&P reviewed. No significant changes noted.  

## 2020-09-17 NOTE — Progress Notes (Signed)
Left message to call back if she had problems

## 2020-09-18 ENCOUNTER — Encounter: Payer: Self-pay | Admitting: Orthopedic Surgery

## 2021-01-20 ENCOUNTER — Telehealth: Payer: Self-pay | Admitting: Physician Assistant

## 2021-01-20 ENCOUNTER — Other Ambulatory Visit: Payer: Self-pay

## 2021-01-20 ENCOUNTER — Encounter: Payer: Self-pay | Admitting: Emergency Medicine

## 2021-01-20 ENCOUNTER — Emergency Department
Admission: EM | Admit: 2021-01-20 | Discharge: 2021-01-20 | Disposition: A | Discharge status: Home / Self Care | Payer: Medicaid Other | Attending: Emergency Medicine | Admitting: Emergency Medicine

## 2021-01-20 DIAGNOSIS — M25562 Pain in left knee: Secondary | ICD-10-CM | POA: Diagnosis not present

## 2021-01-20 DIAGNOSIS — Z79899 Other long term (current) drug therapy: Secondary | ICD-10-CM | POA: Insufficient documentation

## 2021-01-20 DIAGNOSIS — I10 Essential (primary) hypertension: Secondary | ICD-10-CM | POA: Diagnosis not present

## 2021-01-20 DIAGNOSIS — Z87891 Personal history of nicotine dependence: Secondary | ICD-10-CM | POA: Diagnosis not present

## 2021-01-20 DIAGNOSIS — H60312 Diffuse otitis externa, left ear: Secondary | ICD-10-CM

## 2021-01-20 DIAGNOSIS — H60502 Unspecified acute noninfective otitis externa, left ear: Secondary | ICD-10-CM | POA: Diagnosis not present

## 2021-01-20 DIAGNOSIS — H9202 Otalgia, left ear: Secondary | ICD-10-CM | POA: Diagnosis present

## 2021-01-20 MED ORDER — OXYCODONE-ACETAMINOPHEN 5-325 MG PO TABS
1.0000 | ORAL_TABLET | Freq: Once | ORAL | Status: AC
  Administered 2021-01-20: 1 via ORAL
  Filled 2021-01-20: qty 1

## 2021-01-20 MED ORDER — METHYLPREDNISOLONE 4 MG PO TBPK: ORAL_TABLET | ORAL | 0 refills | Status: DC

## 2021-01-20 MED ORDER — OXYCODONE-ACETAMINOPHEN 7.5-325 MG PO TABS: 1.0000 | ORAL_TABLET | Freq: Four times a day (QID) | ORAL | 0 refills | Status: DC | PRN

## 2021-01-20 MED ORDER — PREDNISONE 20 MG PO TABS
60.0000 mg | ORAL_TABLET | Freq: Once | ORAL | Status: AC
  Administered 2021-01-20: 60 mg via ORAL
  Filled 2021-01-20: qty 3

## 2021-01-20 MED ORDER — OXYCODONE-ACETAMINOPHEN 7.5-325 MG PO TABS
1.0000 | ORAL_TABLET | Freq: Four times a day (QID) | ORAL | 0 refills | Status: DC | PRN
Start: 2021-01-20 — End: 2022-03-18

## 2021-01-20 NOTE — ED Triage Notes (Signed)
Pt in w/continued L ear pain and swelling, temp 100.2. states she went to [facility] on Saturday, and has been taking Amoxicillin PO and Neomycin eardrops since, pain unrelieved. States she is unable to chew on the L side d/t pain.

## 2021-01-20 NOTE — Telephone Encounter (Cosign Needed)
Patient changed pharmacy due to insurance issues.

## 2021-01-20 NOTE — Discharge Instructions (Addendum)
Continue previous medication 

## 2021-01-20 NOTE — ED Provider Notes (Signed)
[facility] Emergency Department Provider Note   ____________________________________________   Event Date/Time   First MD Initiated Contact with Patient 01/20/21 [phone]     (approximate)  I have reviewed the triage vital signs and the nursing notes.   HISTORY  Chief Complaint Ear Pain    HPI Laura Watkins is a 30 y.o. female patient presents with left ear pain.  Patient was seen in urgent care 2 days ago.  Patient was given p.o. antibiotics and eardrops.  Patient states pain is worsened.  Patient states the area is swollen was hard to get drops into the ear.  Patient developed fever today.  It was noted patient blood pressure is elevated but she has not taken her daily medication.  Rates her pain as a 10/10.  Describes pain as "achy".  Denies vertigo or hearing loss.         Past Medical History:  Diagnosis Date  . Anemia    during pregnancy  . Hypertension     Patient Active Problem List   Diagnosis Date Noted  . Abdominal pain affecting pregnancy 01/24/2020  . Pregnancy 07/31/2017  . Preterm uterine contractions in third trimester, antepartum 06/03/2017  . Anemia affecting pregnancy in third trimester 05/05/2017  . Normal spontaneous vaginal delivery 06/20/2015    Past Surgical History:  Procedure Laterality Date  . APPENDECTOMY    . COLON SURGERY    . KNEE ARTHROSCOPY WITH ANTERIOR CRUCIATE LIGAMENT (ACL) REPAIR Right 09/16/2020   Procedure: Right ACL reconstruction using quadriceps tendon autograft with lateral meniscal root  repair and chondroplasty;  Surgeon: Signa Kell, MD;  Location: [facility];  Service: Orthopedics;  Laterality: Right;  . SALPINGOSTOMY Bilateral     Prior to Admission medications   Medication Sig Start Date End Date Taking? Authorizing Provider  amoxicillin-clavulanate (AUGMENTIN) 875-125 MG tablet Take 1 tablet by mouth 2 (two) times daily.   Yes [provider]  methylPREDNISolone (MEDROL  DOSEPAK) 4 MG TBPK tablet Take Tapered dose as directed starting tomorrow morning. 01/20/21  Yes Joni Reining, PA-C  neomycin-polymyxin-hydrocortisone (CORTISPORIN) 3.5-10000-1 OTIC suspension Place 4 drops into the left ear 4 (four) times daily.   Yes [provider]  oxyCODONE-acetaminophen (PERCOCET) 7.5-325 MG tablet Take 1 tablet by mouth every 6 (six) hours as needed for severe pain. 01/20/21  Yes Joni Reining, PA-C  acetaminophen (TYLENOL) 500 MG tablet Take 2 tablets (1,000 mg total) by mouth every 8 (eight) hours. 09/16/20 09/16/21  Signa Kell, MD  lisinopril-hydrochlorothiazide (ZESTORETIC) 10-12.5 MG tablet Take 1 tablet by mouth daily.    [provider]  ondansetron (ZOFRAN ODT) 4 MG disintegrating tablet Take 1 tablet (4 mg total) by mouth every 8 (eight) hours as needed for nausea or vomiting. 09/16/20   Signa Kell, MD  propranolol (INNOPRAN XL) 80 MG 24 hr capsule Take 80 mg by mouth daily.    [provider]  sertraline (ZOLOFT) 50 MG tablet Take 50 mg by mouth daily.    [provider]    Allergies Naproxen sodium, Nsaids, and Naproxen  No family history on file.  Social History Social History   Tobacco Use  . Smoking status: Former Smoker    Packs/day: 0.25    Years: 5.00    Pack years: 1.25    Types: Cigarettes    Quit date: 06/2019    Years since quitting: 1.5  . Smokeless tobacco: Never Used  Vaping Use  . Vaping Use: Never used  Substance Use Topics  . Alcohol use: No    Comment: occasional  . Drug use: Yes    Types: Marijuana    Comment: Daily    Review of Systems Constitutional: No fever/chills Eyes: No visual changes. ENT: No sore throat.  Left ear pain. Cardiovascular: Denies chest pain. Respiratory: Denies shortness of breath. Gastrointestinal: No abdominal pain.  No nausea, no vomiting.  No diarrhea.  No constipation. Genitourinary: Negative for dysuria. Musculoskeletal: Negative for back  pain. Skin: Negative for rash. Neurological: Negative for headaches, focal weakness or numbness. Allergic/Immunilogical: NSAIDs. ____________________________________________   PHYSICAL EXAM:  VITAL SIGNS: ED Triage Vitals  Enc Vitals Group     BP 01/20/21 0847 (!) 182/122     Pulse Rate 01/20/21 0847 97     Resp 01/20/21 0847 18     Temp 01/20/21 0847 100.2 F (37.9 C)     Temp Source 01/20/21 0847 Oral     SpO2 01/20/21 0847 99 %     Weight 01/20/21 0858 240 lb 1.3 oz (108.9 kg)     Height 01/20/21 0858 5\' 3"  (1.6 m)     Head Circumference --      Peak Flow --      Pain Score --      Pain Loc --      Pain Edu? --      Excl. in GC? --     Constitutional: Alert and oriented. Well appearing and in no acute distress. Eyes: Conjunctivae are normal. PERRL. EOMI. Head: Atraumatic. Nose: No congestion/rhinnorhea. Mouth/Throat: Mucous membranes are moist.  Oropharynx non-erythematous. EARS: Right canal clear.  Left canal TMs not visible secondary to edema. Neck: No stridor.   Hematological/Lymphatic/Immunilogical: No cervical lymphadenopathy. Cardiovascular: Normal rate, regular rhythm. Grossly normal heart sounds.  Good peripheral circulation.  Elevated blood pressure secondary to not taking daily medication prior to arrival. Respiratory: Normal respiratory effort.  No retractions. Lungs CTAB. Genitourinary: Deferred Musculoskeletal: No lower extremity tenderness nor edema.  No joint effusions. Neurologic:  Normal speech and language. No gross focal neurologic deficits are appreciated. No gait instability. Skin:  Skin is warm, dry and intact. No rash noted. Psychiatric: Mood and affect are normal. Speech and behavior are normal.  ____________________________________________   LABS (all labs ordered are listed, but only abnormal results are displayed)  Labs Reviewed - No data to  display ____________________________________________  EKG   ____________________________________________  RADIOLOGY I, , personally viewed and evaluated these images (plain radiographs) as part of my medical decision making, as well as reviewing the written report by the radiologist.  ED MD interpretation:    Official radiology report(s): No results found.  ____________________________________________   PROCEDURES  Procedure(s) performed (including Critical Care):  Procedures   ____________________________________________   INITIAL IMPRESSION / ASSESSMENT AND PLAN / ED COURSE  As part of my medical decision making, I reviewed the following data within the electronic MEDICAL RECORD NUMBER         Left knee pain secondary to otitis externa.  Patient advised continue previous medication.  Earwick inserted and eardrops applied.  Patient given discharge care instructions.  Patient given prescription for Medrol Dosepak and Percocet.  Advised if no improvement 3 days to follow-up with ENT clinic listed her discharge care instructions.      ____________________________________________   FINAL CLINICAL IMPRESSION(S) / ED DIAGNOSES  Final diagnoses:  Acute diffuse otitis externa of left ear     ED Discharge Orders  Ordered    methylPREDNISolone (MEDROL DOSEPAK) 4 MG TBPK tablet        01/20/21 0908    oxyCODONE-acetaminophen (PERCOCET) 7.5-325 MG tablet  Every 6 hours PRN        01/20/21 0908          *Please note:  Laura Watkins was evaluated in Emergency Department on 01/20/2021 for the symptoms described in the history of present illness. She was evaluated in the context of the global COVID-19 pandemic, which necessitated consideration that the patient might be at risk for infection with the SARS-CoV-2 virus that causes COVID-19. Institutional protocols and algorithms that pertain to the evaluation of patients at risk for COVID-19 are in a state  of rapid change based on information released by regulatory bodies including the CDC and federal and state organizations. These policies and algorithms were followed during the patient's care in the ED.  Some ED evaluations and interventions may be delayed as a result of limited staffing during and the pandemic.*   Note:  This document was prepared using Dragon voice recognition software and may include unintentional dictation errors.    Joni Reining, PA-C 01/20/21 1610    Delton Prairie, MD 01/20/21 [phone]

## 2021-01-20 NOTE — ED Notes (Signed)
Hypertensive in triage, states hx of HTN, but she has not been taking her daily meds

## 2021-01-20 NOTE — ED Notes (Signed)
See triage note  Presents with left ear pain  States she was seen at [facility] for ear infection  Given PO antibiotics and drops  States pain is getting worse  Feels like ear is swollen and drops are not getting into ear  Low grade temp on arrival

## 2021-01-31 NOTE — ED Provider Notes (Incomplete)
[facility] EMERGENCY DEPARTMENT Bjorn Hallas Note   CSN: [id number] Arrival date & time: 01/31/21  1803   History Chief Complaint  Patient presents with  . Abdominal Pain    Laura Watkins is a 30 y.o. female.  The history is provided by the patient.  Abdominal Pain   Past Medical History:  Diagnosis Date  . Allergic rhinitis   . Arthritis   . Headache    chronic migraines   . No pertinent past medical history     Patient Active Problem List   Diagnosis Date Noted  . Atypical chest pain 07/07/2019  . Arthritis 07/06/2017  . Swelling of limb 07/06/2017  . Pain in limb 07/06/2017  . Chronic tension-type headache, not intractable 06/22/2016  . Anxiety state 06/18/2016  . Lumbar radiculopathy 06/18/2016  . Migraine without aura and without status migrainosus, not intractable 06/18/2016  . Paresthesia 06/18/2016  . Vocal cord dysfunction 06/18/2016  . Chronic migraine 09/25/2015  . Seasonal allergic rhinitis due to pollen 09/11/2015  . Dyspnea 03/04/2015  . Chest wall pain 03/04/2015    Past Surgical History:  Procedure Laterality Date  . CESAREAN SECTION    . COLONOSCOPY WITH PROPOFOL N/A 05/31/2017   Procedure: COLONOSCOPY WITH PROPOFOL;  Surgeon: Christena Deem, MD;  Location: [facility] END[facility]COPY;  Service: Endoscopy;  Laterality: N/A;     OB History    Gravida  3   Para  3   Term  3   Preterm  0   AB  0   Living  3     SAB  0   IAB  0   Ectopic  0   Multiple  0   Live Births              Family History  Problem Relation Age of Onset  . Throat cancer Mother   . Osteoarthritis Mother   . Hypertension Father   . Diabetes Mellitus II Sister   . Asthma Sister   . Other Neg Hx   . Stomach cancer Neg Hx   . Pancreatic cancer Neg Hx   . Colon cancer Neg Hx     Social History   Tobacco Use  . Smoking status: Never Smoker  . Smokeless tobacco: Never Used  Vaping Use  . Vaping Use: Never used  Substance Use  Topics  . Alcohol use: No    Alcohol/week: 0.0 standard drinks  . Drug use: No    Home Medications Prior to Admission medications   Medication Sig Start Date End Date Taking? Authorizing Lauralei Clouse  acetaminophen (TYLENOL) 500 MG tablet Take 1,000 mg by mouth every 6 (six) hours as needed for moderate pain or headache.    Quinterious Walraven, Historical, MD  benzonatate (TESSALON) 100 MG capsule Take 1 capsule (100 mg total) by mouth 3 (three) times daily as needed for cough. 11/02/20   Placido Sou, PA-C  cholecalciferol (VITAMIN D3) 25 MCG (1000 UNIT) tablet Take 1,000 Units by mouth daily.    Isa Kohlenberg, Historical, MD  lidocaine (LIDODERM) 5 % Place 1 patch onto the skin daily. Remove & Discard patch within 12 hours or as directed by MD 01/28/20   Henderly, Britni A, PA-C  Multiple Vitamin (MULTI-VITAMIN) tablet Take by mouth.    Arvind Mexicano, Historical, MD  Multiple Vitamin (MULTIVITAMIN ADULT PO) Take by mouth.    Yomar Mejorado, Historical, MD  naproxen (NAPROSYN) 500 MG tablet Take 1 tablet (500 mg total) by mouth 2 (two) times daily. 01/28/20  Henderly, Britni A, PA-C  sertraline (ZOLOFT) 25 MG tablet Take by mouth. 03/19/20 06/17/20  Audriella Blakeley, Historical, MD    Allergies    Latex and Contrast media [iodinated diagnostic agents]  Review of Systems   Review of Systems  Gastrointestinal: Positive for abdominal pain.  All other systems reviewed and are negative.   Physical Exam Updated Vital Signs BP 110/61 (BP Location: Left Arm)   Pulse 73   Temp 99 F (37.2 C)   Resp 16   LMP 07/15/2012   SpO2 100%   Physical Exam Vitals and nursing note reviewed.   *** year old ***female, resting comfortably and in no acute distress. Vital signs are ***. Oxygen saturation is ***%, which is normal. Head is normocephalic and atraumatic. PERRLA, EOMI. Oropharynx is clear. Neck is nontender and supple without adenopathy or JVD. Back is nontender and there is no CVA tenderness. Lungs are clear without rales,  wheezes, or rhonchi. Chest is nontender. Heart has regular rate and rhythm without murmur. Abdomen is soft, flat, nontender without masses or hepatosplenomegaly and peristalsis is normoactive. Extremities have no cyanosis or edema, full range of motion is present. Skin is warm and dry without rash. Neurologic: Mental status is normal, cranial nerves are intact, there are no motor or sensory deficits.  ED Results / Procedures / Treatments   Labs (all labs ordered are listed, but only abnormal results are displayed) Labs Reviewed  COMPREHENSIVE METABOLIC PANEL - Abnormal; Notable for the following components:      Result Value   Calcium 8.8 (*)    All other components within normal limits  CBC - Abnormal; Notable for the following components:   HCT 46.3 (*)    All other components within normal limits  URINALYSIS, ROUTINE W REFLEX MICROSCOPIC - Abnormal; Notable for the following components:   Ketones, ur 80 (*)    All other components within normal limits  LIPASE, BLOOD    EKG None  Radiology No results found.  Procedures Procedures {Remember to document critical care time when appropriate:1}  Medications Ordered in ED Medications - No data to display  ED Course  I have reviewed the triage vital signs and the nursing notes.  Pertinent labs & imaging results that were available during my care of the patient were reviewed by me and considered in my medical decision making (see chart for details).    MDM Rules/Calculators/A&P                          *** Final Clinical Impression(s) / ED Diagnoses Final diagnoses:  None    Rx / DC Orders ED Discharge Orders    None

## 2021-05-01 IMAGING — CR DG KNEE COMPLETE 4+V*R*
4 series · 4 of 4 positions shown · non-contrast
Comparison: None.

CLINICAL DATA: Acute episode of pain after spending time bending on
knees

EXAM:
RIGHT KNEE - COMPLETE 4+ VIEW

[knee ap]
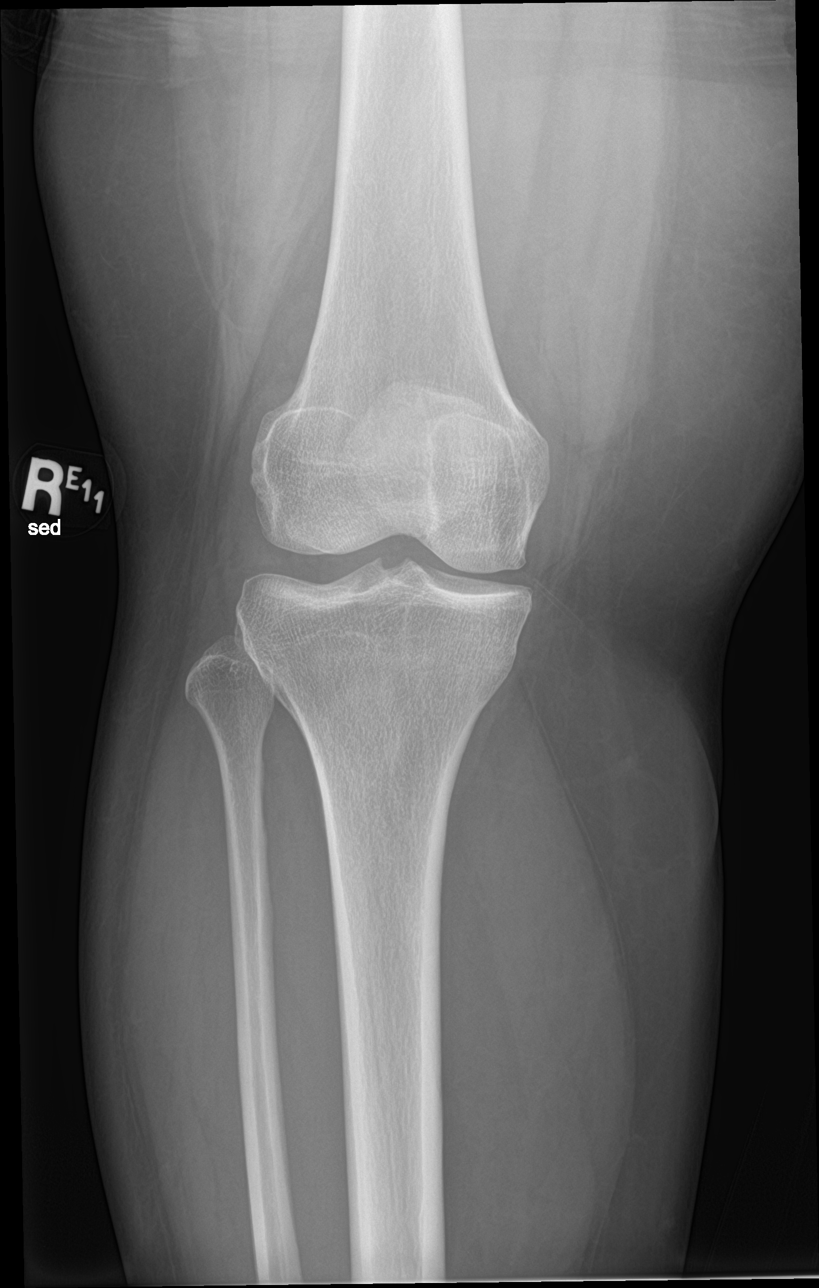

[knee obl (1 of 2)]
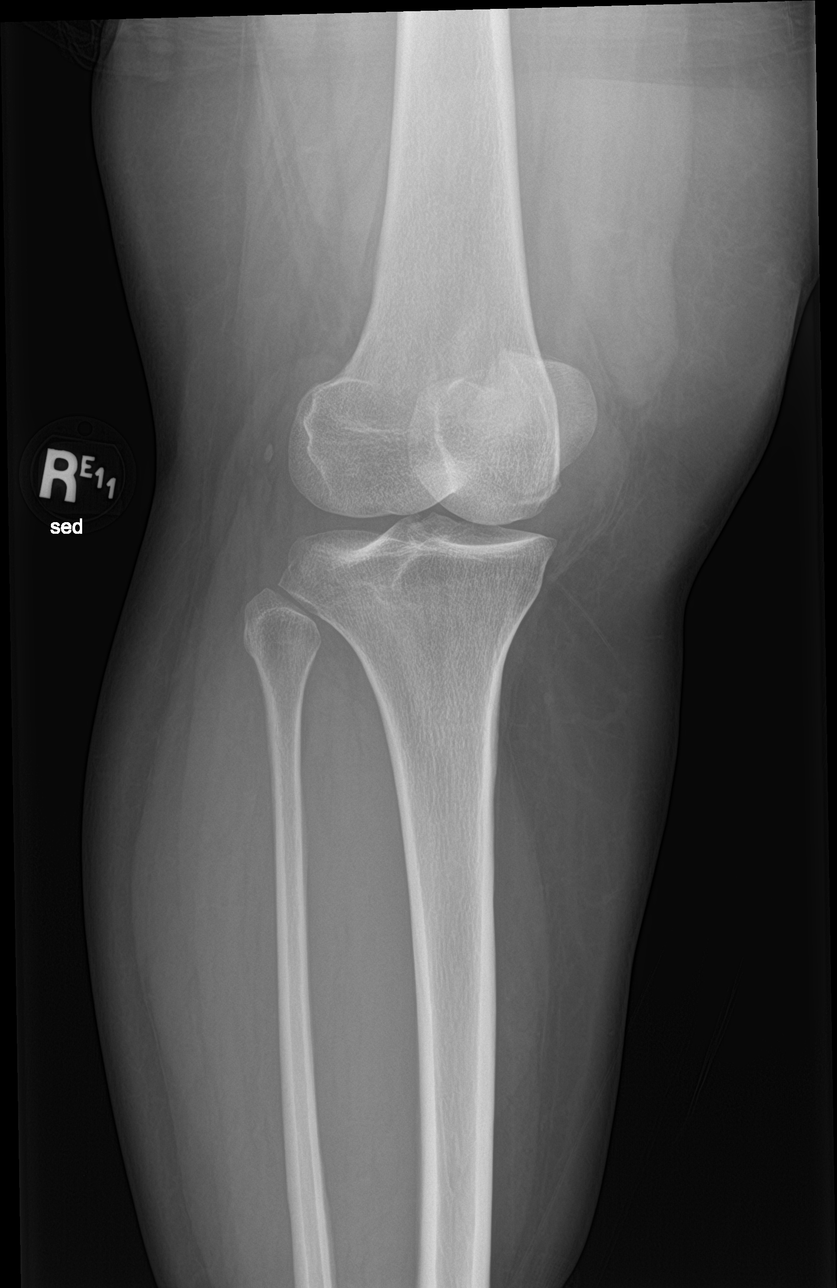

[knee obl (2 of 2)]
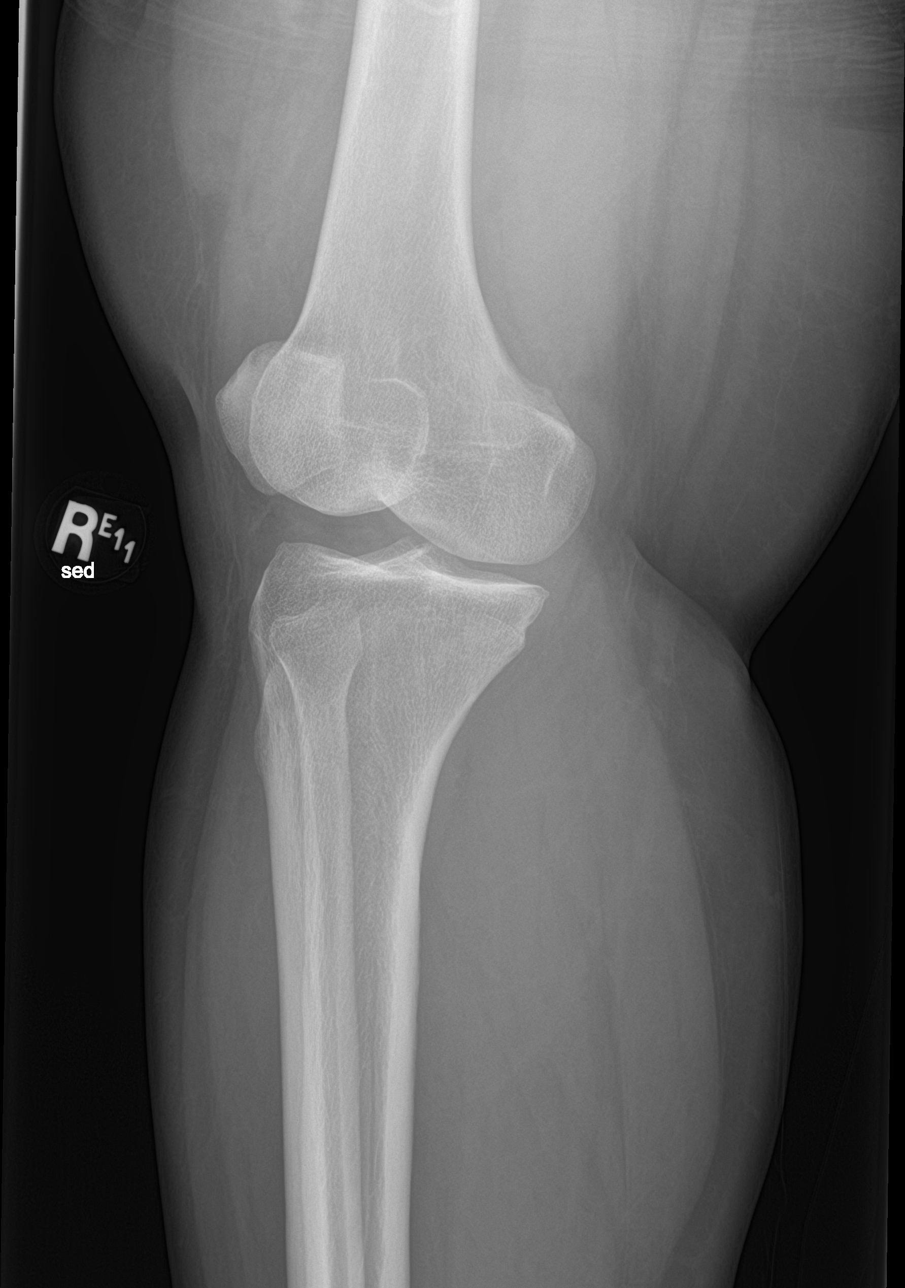

[knee lat]
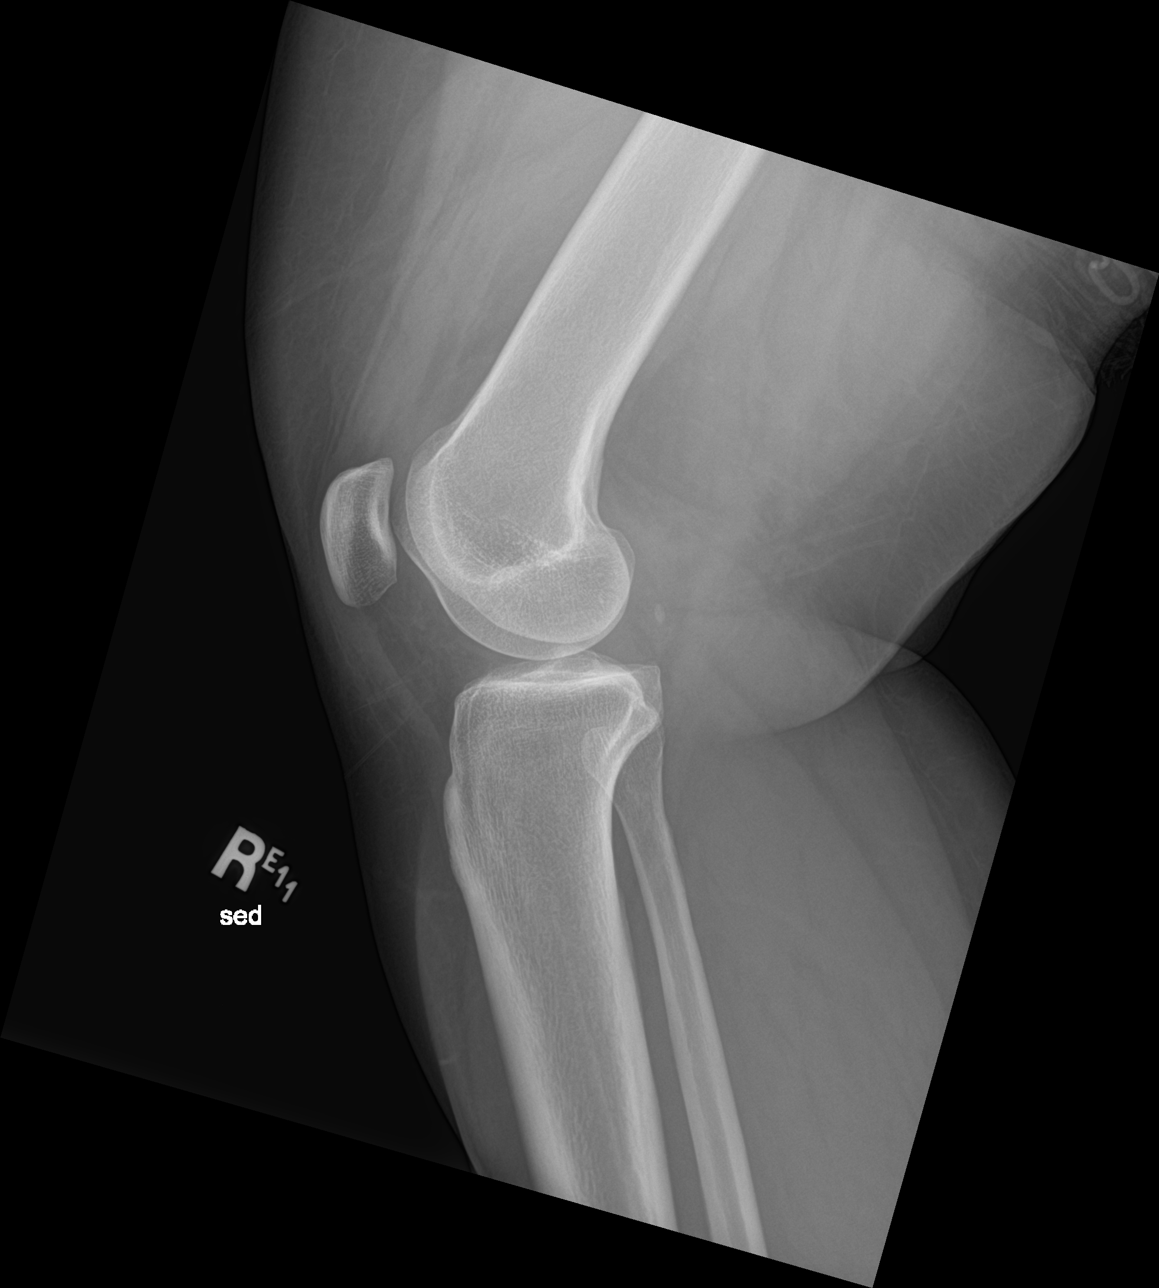

[4 of 4 positions shown; findings below may reference images not displayed]

FINDINGS: Frontal, lateral, and bilateral oblique views obtained. There is no
fracture or dislocation. No joint effusion. Joint spaces appear
unremarkable. No erosive change.
IMPRESSION: No fracture, dislocation, or effusion. No appreciable arthropathic
change.

## 2022-03-18 ENCOUNTER — Encounter: Payer: Self-pay | Admitting: Emergency Medicine

## 2022-03-18 ENCOUNTER — Emergency Department
Admission: EM | Admit: 2022-03-18 | Discharge: 2022-03-18 | Disposition: A | Discharge status: Home / Self Care | Payer: Medicaid Other | Attending: Student in an Organized Health Care Education/Training Program | Admitting: Student in an Organized Health Care Education/Training Program

## 2022-03-18 ENCOUNTER — Other Ambulatory Visit: Payer: Self-pay

## 2022-03-18 DIAGNOSIS — H9201 Otalgia, right ear: Secondary | ICD-10-CM | POA: Insufficient documentation

## 2022-03-18 DIAGNOSIS — H5789 Other specified disorders of eye and adnexa: Secondary | ICD-10-CM | POA: Diagnosis present

## 2022-03-18 DIAGNOSIS — H1011 Acute atopic conjunctivitis, right eye: Secondary | ICD-10-CM | POA: Insufficient documentation

## 2022-03-18 DIAGNOSIS — I10 Essential (primary) hypertension: Secondary | ICD-10-CM | POA: Insufficient documentation

## 2022-03-18 MED ORDER — OLOPATADINE HCL 0.2 % OP SOLN: 1.0000 [drp] | Freq: Every day | OPHTHALMIC | 0 refills | Status: DC

## 2022-03-18 NOTE — ED Provider Notes (Signed)
? ?[facility] ?Provider Note ? ? ? Event Date/Time  ? First MD Initiated Contact with Patient 03/18/22 0915   ?  (approximate) ? ? ?History  ? ?Eye Problem ? ? ?HPI ? ?Charonda Hefter is a 31 y.o. female   presents to the ED with complaint of right eye itching and crustiness x1 week.  Patient states that she obtained some over-the-counter eyedrops which has helped with the symptoms.  She also complains of some right ear discomfort that also affects her right facial area.  Patient denies any fever, chills, coughing or nasal congestion.  No visual changes.  Patient does have a history of hypertension and is on medication but has not taken it today.  She is aware that her blood pressure is elevated. ? ?  ? ? ?Physical Exam  ? ?Triage Vital Signs: ?ED Triage Vitals  ?Enc Vitals Group  ?   BP 03/18/22 0910 (!) 142/97  ?   Pulse Rate 03/18/22 0910 73  ?   Resp 03/18/22 0910 18  ?   Temp 03/18/22 0910 97.8 ?F (36.6 ?C)  ?   Temp Source 03/18/22 0910 Oral  ?   SpO2 03/18/22 0910 97 %  ?   Weight 03/18/22 0906 240 lb 1.3 oz (108.9 kg)  ?   Height 03/18/22 0906 5\' 3"  (1.6 m)  ?   Head Circumference --   ?   Peak Flow --   ?   Pain Score 03/18/22 0906 0  ?   Pain Loc --   ?   Pain Edu? --   ?   Excl. in GC? --   ? ? ?Most recent vital signs: ?Vitals:  ? 03/18/22 0910  ?BP: (!) 142/97  ?Pulse: 73  ?Resp: 18  ?Temp: 97.8 ?F (36.6 ?C)  ?SpO2: 97%  ? ? ? ?General: Awake, no distress.  ?CV:  Good peripheral perfusion.  ?Resp:  Normal effort.  ?Abd:  No distention.  ?Other:  Right conjunctive is clear.  There is some evidence of some crustiness without exudate present.  No swelling noted.  EAC is clear bilaterally.  Right TM dull but no erythema or injection is noted.  No dental caries noted to the posterior molars. ? ? ?ED Results / Procedures / Treatments  ? ?Labs ?(all labs ordered are listed, but only abnormal results are displayed) ?Labs Reviewed - No data to display ? ? ? ?PROCEDURES: ? ?Critical Care  performed:  ? ?Procedures ? ? ?MEDICATIONS ORDERED IN ED: ?Medications - No data to display ? ? ?IMPRESSION / MDM / ASSESSMENT AND PLAN / ED COURSE  ?I reviewed the triage vital signs and the nursing notes. ? ? ?Differential diagnosis includes, but is not limited to, allergic conjunctivitis, bacterial conjunctivitis, seasonal allergies, otitis media, otitis externa. ? ?31 year old female presents to the ED with complaint of right eye itching and crustiness for 1 week.  Patient states that some over-the-counter drops have improved her symptoms but has not completely alleviated them.  She also has some tenderness to her right ear.  On exam there is some clear drainage noted but conjunctive a is without erythema or injection.  Patient was given a prescription for Pataday to use once daily for allergies and is aware that if her left eye begins itching she can may also use that there as well.  She is to follow-up with her PCP if any continued problems. ? ? ? ?  ? ? ?FINAL CLINICAL IMPRESSION(S) / ED DIAGNOSES  ? ?  Final diagnoses:  ?Allergic conjunctivitis of right eye  ? ? ? ?Rx / DC Orders  ? ?ED Discharge Orders   ? ?      Ordered  ?  Olopatadine HCl 0.2 % SOLN  Daily       ? 03/18/22 0933  ? ?  ?  ? ?  ? ? ? ?Note:  This document was prepared using Dragon voice recognition software and may include unintentional dictation errors. ?  ?Tommi Rumps, PA-C ?03/18/22 3557 ? ?  ?Willy Eddy, MD ?03/18/22 1256 ? ?

## 2022-03-18 NOTE — Discharge Instructions (Signed)
Follow-up with your primary care provider if any continued problems.  Begin using Olopatadine eyedrops to your right eye once a day and should you begin having symptoms in your left eye also use that they are to.  You can obtain over-the-counter allergy medications such as Zyrtec or Claritin which will help with allergies as well.  This is not considered contagious. ?

## 2022-03-18 NOTE — ED Triage Notes (Signed)
C/O right eye swelling and crustiness x 1 week.  States symptoms have improved.  Right eye- sclera white.  No swelling appreciated.  Also c/o right ear/ jaw pain. ? ?AAOx3.  Skin warm and dyr. NAD ?

## 2022-07-04 ENCOUNTER — Emergency Department: Payer: Medicaid Other

## 2022-07-04 ENCOUNTER — Encounter: Payer: Self-pay | Admitting: Emergency Medicine

## 2022-07-04 ENCOUNTER — Other Ambulatory Visit: Payer: Self-pay

## 2022-07-04 ENCOUNTER — Emergency Department
Admission: EM | Admit: 2022-07-04 | Discharge: 2022-07-04 | Disposition: A | Discharge status: Home / Self Care | Payer: Medicaid Other | Attending: Emergency Medicine | Admitting: Emergency Medicine

## 2022-07-04 DIAGNOSIS — R109 Unspecified abdominal pain: Secondary | ICD-10-CM | POA: Diagnosis present

## 2022-07-04 DIAGNOSIS — K529 Noninfective gastroenteritis and colitis, unspecified: Secondary | ICD-10-CM | POA: Diagnosis not present

## 2022-07-04 DIAGNOSIS — I10 Essential (primary) hypertension: Secondary | ICD-10-CM | POA: Diagnosis not present

## 2022-07-04 LAB — CBC
HCT: 36.4 % (ref 36.0–46.0)
Hemoglobin: 11.2 g/dL — ABNORMAL LOW (ref 12.0–15.0)
MCH: 21.9 pg — ABNORMAL LOW (ref 26.0–34.0)
MCHC: 30.8 g/dL (ref 30.0–36.0)
MCV: 71.1 fL — ABNORMAL LOW (ref 80.0–100.0)
Platelets: 220 10*3/uL (ref 150–400)
RBC: 5.12 MIL/uL — ABNORMAL HIGH (ref 3.87–5.11)
RDW: 15.5 % (ref 11.5–15.5)
WBC: 8.1 10*3/uL (ref 4.0–10.5)
nRBC: 0 % (ref 0.0–0.2)

## 2022-07-04 LAB — COMPREHENSIVE METABOLIC PANEL
ALT: 10 U/L (ref 0–44)
AST: 15 U/L (ref 15–41)
Albumin: 3.8 g/dL (ref 3.5–5.0)
Alkaline Phosphatase: 49 U/L (ref 38–126)
Anion gap: 5 (ref 5–15)
BUN: 7 mg/dL (ref 6–20)
CO2: 26 mmol/L (ref 22–32)
Calcium: 8.9 mg/dL (ref 8.9–10.3)
Chloride: 108 mmol/L (ref 98–111)
Creatinine, Ser: 0.57 mg/dL (ref 0.44–1.00)
GFR, Estimated: >60 mL/min (ref >60)
Glucose, Bld: 100 mg/dL — ABNORMAL HIGH (ref 70–99)
Potassium: 3.4 mmol/L — ABNORMAL LOW (ref 3.5–5.1)
Sodium: 139 mmol/L (ref 135–145)
Total Bilirubin: 0.6 mg/dL (ref 0.3–1.2)
Total Protein: 7.1 g/dL (ref 6.5–8.1)

## 2022-07-04 LAB — URINALYSIS, ROUTINE W REFLEX MICROSCOPIC
Bilirubin Urine: NEGATIVE
Glucose, UA: NEGATIVE mg/dL
Hgb urine dipstick: NEGATIVE
Ketones, ur: NEGATIVE mg/dL
Leukocytes,Ua: NEGATIVE
Nitrite: NEGATIVE
Protein, ur: NEGATIVE mg/dL
Specific Gravity, Urine: 1.028 (ref 1.005–1.030)
pH: 5 (ref 5.0–8.0)

## 2022-07-04 LAB — LIPASE, BLOOD: Lipase: 26 U/L (ref 11–51)

## 2022-07-04 LAB — POC URINE PREG, ED: Preg Test, Ur: NEGATIVE

## 2022-07-04 MED ORDER — OXYCODONE-ACETAMINOPHEN 5-325 MG PO TABS
1.0000 | ORAL_TABLET | Freq: Once | ORAL | Status: AC
  Administered 2022-07-04: 1 via ORAL
  Filled 2022-07-04: qty 1

## 2022-07-04 MED ORDER — ONDANSETRON 4 MG PO TBDP
4.0000 mg | ORAL_TABLET | Freq: Once | ORAL | Status: AC
  Administered 2022-07-04: 4 mg via ORAL
  Filled 2022-07-04: qty 1

## 2022-07-04 NOTE — ED Triage Notes (Addendum)
Pt reports noticed a lump on her right side abd that is painful to push on. Pt reports noticed discomfort 2 weeks ago but saw the lump today. Pt also reports has surgery to her right knee awhile ago and is having pain to the that knee as well. Pt denies NVD, fevers or other sx's.

## 2022-07-04 NOTE — Discharge Instructions (Signed)
Your CAT scan showed inflammation in your small bowel which could be consistent with either an infectious or inflammatory ileitis.  It is possible that you have Crohn's disease or ulcerative colitis but in order to be diagnosed with this you will need to see gastroenterology.  In the meantime you can continue to take Tylenol for your pain.  If you develop fevers worsening abdominal pain or blood in your stool please return to the emergency department.

## 2022-07-04 NOTE — ED Provider Notes (Signed)
[facility] Provider Note    Event Date/Time   First MD Initiated Contact with Patient 07/04/22 0930     (approximate)   History   Abdominal Pain   HPI  Laura Watkins is a 31 y.o. female past medical history of hypertension who presents with abdominal pain.  Symptoms are going on for about 2 weeks.  Patient endorses pain to the right mid abdomen radiating around to the right low back.  No pain down the legs.  It is a constant pain but does have periods of worsening is worse when she lays on that side and when she moves around denies vomiting has had some nausea.  Was initially having some issues with constipation last BM 2 days ago denies fevers chills or urinary symptoms.  She is currently on her menstrual period.  No history of similar.  Patient also complaining of knee pain.  She had her ACL meniscus repaired about 2 years ago has had pain since but is worse over the last 2 days.  Denies any injury still able to ambulate.  Has not taken anything over-the-counter for pain.     Past Medical History:  Diagnosis Date   Anemia    during pregnancy   Hypertension     Patient Active Problem List   Diagnosis Date Noted   Abdominal pain affecting pregnancy 01/24/2020   Pregnancy 07/31/2017   Preterm uterine contractions in third trimester, antepartum 06/03/2017   Anemia affecting pregnancy in third trimester 05/05/2017   Normal spontaneous vaginal delivery 06/20/2015     Physical Exam  Triage Vital Signs: ED Triage Vitals  Enc Vitals Group     BP 07/04/22 0812 (!) 143/109     Pulse Rate 07/04/22 0812 72     Resp 07/04/22 0812 17     Temp 07/04/22 0812 98.4 F (36.9 C)     Temp Source 07/04/22 0812 Oral     SpO2 07/04/22 0812 98 %     Weight 07/04/22 0816 187 lb (84.8 kg)     Height 07/04/22 0816 5\' 3"  (1.6 m)     Head Circumference --      Peak Flow --      Pain Score 07/04/22 0816 8     Pain Loc --      Pain Edu? --      Excl. in GC? --      Most recent vital signs: Vitals:   07/04/22 0815 07/04/22 1020  BP: (!) 143/109 (!) 157/113  Pulse: 77 70  Resp: 20 16  Temp: 98.4 F (36.9 C)   SpO2: 99% 100%     General: Awake, no distress.  CV:  Good peripheral perfusion.  Resp:  Normal effort.  Abd:  No distention.  Abdomen is soft, there is tenderness to palpation in the right mid abdomen without palpable mass, + right CVA tenderness Neuro:             Awake, Alert, Oriented x 3  Other:  Right knee is without deformity swelling or effusion no erythema, able to range without difficulty, 2+ DP pulse   ED Results / Procedures / Treatments  Labs (all labs ordered are listed, but only abnormal results are displayed) Labs Reviewed  COMPREHENSIVE METABOLIC PANEL - Abnormal; Notable for the following components:      Result Value   Potassium 3.4 (*)    Glucose, Bld 100 (*)    All other components within normal limits  CBC - Abnormal;  Notable for the following components:   RBC 5.12 (*)    Hemoglobin 11.2 (*)    MCV 71.1 (*)    MCH 21.9 (*)    All other components within normal limits  URINALYSIS, ROUTINE W REFLEX MICROSCOPIC - Abnormal; Notable for the following components:   Color, Urine YELLOW (*)    APPearance HAZY (*)    All other components within normal limits  LIPASE, BLOOD  POC URINE PREG, ED     EKG     RADIOLOGY CT abdomen pelvis interpreted myself shows inflammation in the terminal ileum   PROCEDURES:  Critical Care performed: No  Procedures   MEDICATIONS ORDERED IN ED: Medications  oxyCODONE-acetaminophen (PERCOCET/ROXICET) 5-325 MG per tablet 1 tablet (1 tablet Oral Given 07/04/22 1016)  ondansetron (ZOFRAN-ODT) disintegrating tablet 4 mg (4 mg Oral Given 07/04/22 1016)     IMPRESSION / MDM / ASSESSMENT AND PLAN / ED COURSE  I reviewed the triage vital signs and the nursing notes.                              Patient's presentation is most consistent with acute complicated illness  / injury requiring diagnostic workup.  Differential diagnosis includes, but is not limited to, ventral hernia, bowel obstruction, colitis, enteritis, musculoskeletal pain, kidney stone  The patient is a 31 year old female who presents with right sided abdominal pain.  Symptoms ongoing for the last 2 weeks pain is really located in the right flank radiates around to the right mid and lower back.  Associate with nausea no urinary symptoms no fevers.  On exam she overall looks well.  She is tender in the right mid abdomen do not feel obvious palpable mass but her body habitus does somewhat limit the exam.  Also has some mild right CVA tenderness.  Seems kind of musculoskeletal numbness worse with movement worse when she lies on that side but she does have significant abdominal tenderness on exam so we will obtain a CT abdomen pelvis.  Has history of appendectomy in the past so no suspicion for appendicitis.  Labs are reassuring including normal white count no significant electrolyte abnormalities normal LFTs and lipase pregnancy test negative UA without evidence of infection no red cells.  We will treat pain with Percocet and nausea with ODT Zofran.  CT abdomen pelvis shows nonspecific inflammation around the terminal ileum.  Patient does not have any diarrhea has not had fevers suspicion for infectious ileitis is lower based on this.  Differential remains broad but I would be concern for inflammatory bowel disease.  On further questioning patient has had some weight loss and some joint pain.  No family history or personal history of IBD.  Do not feel that antibiotics will be of use especially given we do not have any stool studies and there is no significant diarrhea or other infectious symptoms.  Will refer to GI.  Patient unable to take NSAIDs due to anaphylaxis.  Recommended Tylenol.  Discussed return for fever worsening abdominal pain or blood in the stool.       FINAL CLINICAL IMPRESSION(S) / ED  DIAGNOSES   Final diagnoses:  Ileitis     Rx / DC Orders   ED Discharge Orders     None        Note:  This document was prepared using Dragon voice recognition software and may include unintentional dictation errors.   Georga Hacking, MD 07/04/22 1101

## 2022-07-04 NOTE — ED Notes (Signed)
Patient to CT via wheelchair.

## 2022-07-17 ENCOUNTER — Encounter: Payer: Self-pay | Admitting: Oncology

## 2022-07-24 ENCOUNTER — Ambulatory Visit (INDEPENDENT_AMBULATORY_CARE_PROVIDER_SITE_OTHER): Payer: Medicaid Other | Admitting: Obstetrics and Gynecology

## 2022-07-24 ENCOUNTER — Encounter: Payer: Self-pay | Admitting: Obstetrics and Gynecology

## 2022-07-24 VITALS — BP 142/90 | Ht 63.0 in | Wt 189.0 lb

## 2022-07-24 DIAGNOSIS — N926 Irregular menstruation, unspecified: Secondary | ICD-10-CM

## 2022-07-24 DIAGNOSIS — Z7689 Persons encountering health services in other specified circumstances: Secondary | ICD-10-CM

## 2022-07-24 DIAGNOSIS — R102 Pelvic and perineal pain: Secondary | ICD-10-CM | POA: Diagnosis not present

## 2022-07-24 NOTE — Progress Notes (Signed)
HPI:      [name] is a 31 y.o. [id number] who LMP was No LMP recorded. (Menstrual status: IUD).  Subjective:   She presents today after being seen in the emergency department for pelvic pain and irregular bleeding.  She has had pain and bleeding ever since her IUD was placed.  Prior to IUD placement patient had 7-day menses several of those days being very heavy.  She an IUD for birth cycle and bleeding control.  She has never really liked her IUD because she has had pain and irregular bleeding from the very start. She states that she cannot take OCPs because she is not reliable regarding this and she does not want any more children.  She previously tried Depo-Provera and she had weight gain. Recent CT reveals IUD appears to be within the uterus.  Unable to tell if its high enough.  Inflammatory bowel disease possible based on CT.  Patient is trying to be scheduled for follow-up through [facility] or [facility].    Hx: The following portions of the patient's history were reviewed and updated as appropriate:             She  has a past medical history of Anemia and Hypertension. She does not have any pertinent problems on file. She  has a past surgical history that includes Appendectomy; Colon surgery; Salpingostomy (Bilateral); and Knee arthroscopy with anterior cruciate ligament (acl) repair (Right, 09/16/2020). Her family history includes Arthritis in her father and mother; Hypertension in her mother. She  reports that she has been smoking cigarettes. She has a 1.25 pack-year smoking history. She has never used smokeless tobacco. She reports current drug use. Drug: Marijuana. She reports that she does not drink alcohol. She has a current medication list which includes the following prescription(s): ferrous sulfate and lisinopril-hydrochlorothiazide. She is allergic to naproxen sodium, nsaids, and naproxen.       Review of Systems:  Review of Systems  Constitutional: Denied constitutional  symptoms, night sweats, recent illness, fatigue, fever, insomnia and weight loss.  Eyes: Denied eye symptoms, eye pain, photophobia, vision change and visual disturbance.  Ears/Nose/Throat/Neck: Denied ear, nose, throat or neck symptoms, hearing loss, nasal discharge, sinus congestion and sore throat.  Cardiovascular: Denied cardiovascular symptoms, arrhythmia, chest pain/pressure, edema, exercise intolerance, orthopnea and palpitations.  Respiratory: Denied pulmonary symptoms, asthma, pleuritic pain, productive sputum, cough, dyspnea and wheezing.  Gastrointestinal: Denied, gastro-esophageal reflux, melena, nausea and vomiting.  Genitourinary: See HPI for additional information.  Musculoskeletal: Denied musculoskeletal symptoms, stiffness, swelling, muscle weakness and myalgia.  Dermatologic: Denied dermatology symptoms, rash and scar.  Neurologic: Denied neurology symptoms, dizziness, headache, neck pain and syncope.  Psychiatric: Denied psychiatric symptoms, anxiety and depression.  Endocrine: Denied endocrine symptoms including hot flashes and night sweats.   Meds:   Current Outpatient Medications on File Prior to Visit  Medication Sig Dispense Refill   ferrous sulfate 325 (65 FE) MG tablet Take 325 mg by mouth daily with breakfast.     lisinopril-hydrochlorothiazide (ZESTORETIC) 10-12.5 MG tablet Take 1 tablet by mouth daily.     No current facility-administered medications on file prior to visit.      Objective:     Vitals:   07/24/22 0924  BP: (!) 142/90   Filed Weights   07/24/22 0924  Weight: 189 lb (85.7 kg)              CT results reviewed directly with the patient.  Assessment:    [id number] Patient Active Problem List   Diagnosis Date Noted   Abdominal pain affecting pregnancy 01/24/2020   Pregnancy 07/31/2017   Preterm uterine contractions in third trimester, antepartum 06/03/2017   Anemia affecting pregnancy in third trimester 05/05/2017   Normal  spontaneous vaginal delivery 06/20/2015     1. Establishing care with new doctor, encounter for   2. Pelvic pain   3. Irregular menstrual bleeding     Based on her history of pain and bleeding after IUD insertion as well as her strings previously being too long and having to be cut it seems like that her IUD is too low in the uterus. Patient not a good candidate for 1 reason or another for multiple other birth control methods for cycle control methods.   Plan:            1.  We have discussed multiple options she has chosen to have this IUD removed and replaced with a Mirena.  The goal is to get better cycle control and continue birth control. Orders No orders of the defined types were placed in this encounter.   No orders of the defined types were placed in this encounter.     F/U  Return in about 3 weeks (around 08/14/2022). I spent 31 minutes involved in the care of this patient preparing to see the patient by obtaining and reviewing her medical history (including labs, imaging tests and prior procedures), documenting clinical information in the electronic health record (EHR), counseling and coordinating care plans, writing and sending prescriptions, ordering tests or procedures and in direct communicating with the patient and medical staff discussing pertinent items from her history and physical exam.  Elonda Husky, M.D. 07/24/2022 9:54 AM

## 2022-07-24 NOTE — Progress Notes (Signed)
Patient presents today to discuss pelvic pain and irregular menstrual cycles. She states with her Mirena IUD she continues to have abnormal bleeding from spotting to heavy flow with severe cramping. Patient states other issues with gastro and colon, following up for further testing. No other concerns at this time.

## 2022-07-25 ENCOUNTER — Encounter: Payer: Self-pay | Admitting: Obstetrics and Gynecology

## 2022-09-04 ENCOUNTER — Other Ambulatory Visit: Payer: Self-pay | Admitting: Primary Care

## 2022-09-04 DIAGNOSIS — R109 Unspecified abdominal pain: Secondary | ICD-10-CM

## 2022-09-07 ENCOUNTER — Encounter: Payer: Self-pay | Admitting: Primary Care

## 2022-09-15 ENCOUNTER — Ambulatory Visit: Payer: Medicaid Other

## 2023-01-28 ENCOUNTER — Telehealth: Payer: Self-pay | Admitting: Obstetrics and Gynecology

## 2023-01-28 NOTE — Telephone Encounter (Signed)
Reached out to pt about Iappt for IUD removal and replacement.  Call could not be completed at this time.

## 2023-01-29 NOTE — Telephone Encounter (Signed)
I attempt to reach the patient via phone. Call could not be completed as dialed.

## 2023-02-28 NOTE — Progress Notes (Deleted)
   No chief complaint on file.    History of Present Illness:  Laura Watkins is a 32 y.o. that had a {IUD:23561} IUD placed approximately {NUMBERS:20191} {MONTH/YR:310907} ago. Since that time, she denies dyspareunia, pelvic pain, non-menstrual bleeding, vaginal d/c, heavy bleeding.  IUD removed for pelvic pai and bleeding, replace with Mirena???  There were no vitals taken for this visit.  Pelvic exam:  Two IUD strings {DESC; PRESENT/ABSENT:17923} seen coming from the cervical os. EGBUS, vaginal vault and cervix: within normal limits  IUD Removal Strings of IUD identified and grasped.  IUD removed without problem with ring forceps.  Pt tolerated this well.  IUD noted to be intact.  Assessment:  No diagnosis found.    Plan: IUD removed and plan for contraception is {PLAN CONTRACEPTION:313102}. She was amenable to this plan.  Ryanne Morand B. Deshonna Trnka, PA-C 02/28/2023 8:20 PM   No chief complaint on file.    IUD PROCEDURE NOTE:  [name] is a 32 y.o. [id number] here for {type:23561}  IUD insertion for ***   There were no vitals taken for this visit.  IUD Insertion Procedure Note Patient identified, informed consent performed, consent signed.   Discussed risks of irregular bleeding, cramping, infection, malpositioning or misplacement of the IUD outside the uterus which may require further procedure such as laparoscopy, risk of failure <1%. Time out was performed.  Urine pregnancy test negative.***  Speculum placed in the vagina.  Cervix visualized.  Cleaned with Betadine x 2.  Grasped anteriorly with a single tooth tenaculum.  Uterus sounded to *** cm.   IUD placed per manufacturer's recommendations.  Strings trimmed to 3 cm. Tenaculum was removed, good hemostasis noted.  Patient tolerated procedure well.   ASSESSMENT:  No diagnosis found.   No orders of the defined types were placed in this encounter.    Plan:  Patient was given post-procedure instructions.  She  was advised to have backup contraception for one week.   Call if you are having increasing pain, cramps or bleeding or if you have a fever greater than 100.4 degrees F., shaking chills, nausea or vomiting. Patient was also asked to check IUD strings periodically and follow up in 4 weeks for IUD check.  No follow-ups on file.  Jevon Shells B. Zolton Dowson, PA-C 02/28/2023 8:21 PM

## 2023-03-01 ENCOUNTER — Ambulatory Visit: Payer: Medicaid Other | Admitting: Obstetrics and Gynecology

## 2023-03-01 DIAGNOSIS — Z1151 Encounter for screening for human papillomavirus (HPV): Secondary | ICD-10-CM

## 2023-06-27 ENCOUNTER — Encounter: Payer: Self-pay | Admitting: Oncology
